# Patient Record
Sex: Male | Born: 1955 | Race: White | Hispanic: No | Marital: Single | Smoking: Former smoker
Health system: Southern US, Community
[De-identification: ages and names within clinical notes are randomized; demographics above are authoritative.]

## PROBLEM LIST (undated history)

## (undated) DIAGNOSIS — I10 Essential (primary) hypertension: Secondary | ICD-10-CM

## (undated) HISTORY — DX: Essential (primary) hypertension: I10

## (undated) HISTORY — PX: REPLACEMENT TOTAL KNEE: SUR1224

---

## 2010-09-25 ENCOUNTER — Other Ambulatory Visit: Payer: Self-pay | Admitting: Orthopedic Surgery

## 2010-09-25 ENCOUNTER — Other Ambulatory Visit (HOSPITAL_COMMUNITY): Payer: Self-pay | Admitting: Orthopedic Surgery

## 2010-09-25 ENCOUNTER — Ambulatory Visit (HOSPITAL_COMMUNITY)
Admission: RE | Admit: 2010-09-25 | Discharge: 2010-09-25 | Disposition: A | Discharge status: Home / Self Care | Payer: 59 | Source: Ambulatory Visit | Attending: Orthopedic Surgery | Admitting: Orthopedic Surgery

## 2010-09-25 ENCOUNTER — Encounter (HOSPITAL_COMMUNITY): Payer: 59

## 2010-09-25 DIAGNOSIS — Z01812 Encounter for preprocedural laboratory examination: Secondary | ICD-10-CM | POA: Insufficient documentation

## 2010-09-25 DIAGNOSIS — Z01818 Encounter for other preprocedural examination: Secondary | ICD-10-CM

## 2010-09-25 DIAGNOSIS — I10 Essential (primary) hypertension: Secondary | ICD-10-CM | POA: Insufficient documentation

## 2010-09-25 DIAGNOSIS — E119 Type 2 diabetes mellitus without complications: Secondary | ICD-10-CM | POA: Insufficient documentation

## 2010-09-25 LAB — BASIC METABOLIC PANEL
BUN: 13 mg/dL (ref 6–23)
CO2: 27 mEq/L (ref 19–32)
Chloride: 100 mEq/L (ref 96–112)
Creatinine, Ser: 1.09 mg/dL (ref 0.50–1.35)

## 2010-09-25 LAB — CBC
MCH: 31.1 pg (ref 26.0–34.0)
MCV: 89 fL (ref 78.0–100.0)
Platelets: 208 10*3/uL (ref 150–400)
RDW: 12.1 % (ref 11.5–15.5)
WBC: 6.1 10*3/uL (ref 4.0–10.5)

## 2010-09-25 LAB — DIFFERENTIAL
Eosinophils Absolute: 0.2 10*3/uL (ref 0.0–0.7)
Eosinophils Relative: 3 % (ref 0–5)
Lymphs Abs: 2.3 10*3/uL (ref 0.7–4.0)
Monocytes Relative: 11 % (ref 3–12)

## 2010-09-25 LAB — URINALYSIS, ROUTINE W REFLEX MICROSCOPIC
Bilirubin Urine: NEGATIVE
Glucose, UA: NEGATIVE mg/dL
Hgb urine dipstick: NEGATIVE
Ketones, ur: NEGATIVE mg/dL
pH: 6.5 (ref 5.0–8.0)

## 2010-10-03 ENCOUNTER — Ambulatory Visit (HOSPITAL_COMMUNITY)
Admission: RE | Admit: 2010-10-03 | Discharge: 2010-10-04 | Disposition: A | Discharge status: Home Health | Payer: 59 | Source: Ambulatory Visit | Attending: Orthopedic Surgery | Admitting: Orthopedic Surgery

## 2010-10-03 DIAGNOSIS — I10 Essential (primary) hypertension: Secondary | ICD-10-CM | POA: Insufficient documentation

## 2010-10-03 DIAGNOSIS — Z79899 Other long term (current) drug therapy: Secondary | ICD-10-CM | POA: Insufficient documentation

## 2010-10-03 DIAGNOSIS — E785 Hyperlipidemia, unspecified: Secondary | ICD-10-CM | POA: Insufficient documentation

## 2010-10-03 DIAGNOSIS — M1710 Unilateral primary osteoarthritis, unspecified knee: Principal | ICD-10-CM | POA: Insufficient documentation

## 2010-10-03 DIAGNOSIS — E119 Type 2 diabetes mellitus without complications: Secondary | ICD-10-CM | POA: Insufficient documentation

## 2010-10-03 LAB — GLUCOSE, CAPILLARY: Glucose-Capillary: 107 mg/dL — ABNORMAL HIGH (ref 70–99)

## 2010-10-03 LAB — TYPE AND SCREEN

## 2010-10-04 LAB — BASIC METABOLIC PANEL
BUN: 11 mg/dL (ref 6–23)
Calcium: 9 mg/dL (ref 8.4–10.5)
Creatinine, Ser: 1.01 mg/dL (ref 0.50–1.35)
GFR calc non Af Amer: >60 mL/min (ref >60)
Glucose, Bld: 130 mg/dL — ABNORMAL HIGH (ref 70–99)
Sodium: 138 mEq/L (ref 135–145)

## 2010-10-04 LAB — CBC
Hemoglobin: 14.3 g/dL (ref 13.0–17.0)
MCH: 30.3 pg (ref 26.0–34.0)
MCHC: 33.4 g/dL (ref 30.0–36.0)
MCV: 90.7 fL (ref 78.0–100.0)

## 2010-10-07 NOTE — Op Note (Signed)
Matthew Harris, Matthew Harris                 ACCOUNT NO.:  [id number]  MEDICAL RECORD NO.:  [id number]  LOCATION:  1609                         FACILITY:  [facility]  PHYSICIAN:  Madlyn Frankel. Charlann Boxer, M.D.  DATE OF BIRTH:  08-05-1955  DATE OF PROCEDURE:  10/03/2010 DATE OF DISCHARGE:                              OPERATIVE REPORT   PREOPERATIVE DIAGNOSIS:  Left knee medial compartment osteoarthritis.  POSTOPERATIVE DIAGNOSIS:  Left knee medial compartment osteoarthritis.  PROCEDURE:  Left partial knee replacement utilizing Biomet components, a size medium twin peg femur, a size B medial left tibial tray and a 3 insert to match the above components.  SURGEON:  Madlyn Frankel. Charlann Boxer, M.D.  ASSISTANT:  Jaquelyn Bitter. Chabon, P.A.  ANESTHESIA:  Spinal.  SPECIMENS:  None.  COMPLICATIONS:  None.  DRAINS:  Hemovac.  BLOOD LOSS:  Minimal.  TOURNIQUET TIME:  39 minutes, 250 mmHg.  INDICATIONS OF THE PROCEDURE:  Mr. Age is a 55 year old gentleman who was seen and evaluated in the office for a left knee pain.  Radiographs revealed bone-on-bone changes medially.  He had failed conservative measures, had bone-on-bone changes and at this point wished for more definitive measures.  After reviewing his radiographic findings, location of his pain, his age and desired activity level, it was felt that he was an ideal candidate for partial knee replacement.  We discussed the risks and benefits, pros and cons of this first total knee replacement.  Standard risk of infection, DVTs, component failure, need for revision surgery all discussed and reviewed. Consent was obtained for benefit of pain relief.  PROCEDURE IN DETAIL:  The patient was brought to operative theater. Once adequate anesthesia and preoperative antibiotics, Ancef administered, the patient was positioned supine with his left thigh tourniquet placed and his left leg placed over the Oxford leg holder to allow for 120 degrees of flexion.  A time-out  was performed, identifying the patient, planned procedure and extremity.  Leg was exsanguinated, tourniquet elevated to 250 mmHg. Paramidline incision made for the proximal pole of the patella to the tibial tubercle, soft-tissue planes created and a partial median arthrotomy made.  Following initial synovectomy and removal of the anterior horn of the medial meniscus, retractors were placed along the medial aspect the proximal tibia and the patellar retractor placed.  The extramedullary guide was passed with a medium spoon in place, the jig for 4-mm resection was placed.  Once I was satisfied that the tibia was lined up parallel to the shaft of the alignment rod, the reciprocating saw was used to the medial aspect of the femoral notch and then the oscillating saw to remove the fragment.  The cut surface seemed to be best fit with a size B tibial tray following removal of medial osteophytes.  The size 3 feeler gauge appeared at this point to have appropriate tension.  At this point, the intramedullary canal of femur was opened, the intramedullary rod passed.  The jig for the posterior cutting block was then positioned onto the cut surface of the proximal tibia and oriented in relationship to the intramedullary rod.  I had marked the midportion of the medial femoral condyle.  The two drill holes  were then made and they were in line with this.  Medial osteophytes had been removed off the distal femur and anteriorly.  The posterior cutting block was then positioned and the posterior resection of the femur made.  At this point based on the flexion gap, I chose a 4-miller from just experience as well as his knee.  I milled the distal femur, removing excessive bone around the knee.  At this point with a medium femur in place, the size 3 feeler gauge was balanced from extension to flexion and felt very good.  At this point, the final tibial preparation was carried out, pinning the tray  into place.  The reciprocating saw was used to create a trough, and small osteotome used to remove bone fragments.  I then milled off the anterior aspect of the femur to allow for the polyethylene motion.  Final trial at this point was carried out with a keeled tray in place at the medium femur and the lollipop 3.  Again, at 90 degrees of flexion and 20 degrees of flexion, there was symmetric tension on this insert.  All trial components were removed.  The sclerotic bone was drilled with a drill.  The knee was irrigated the synovial-capsular junction along the medial aspect of the knee was injected with 0.25% Marcaine with epinephrine and 1 mL of Toradol.  Cement was mixed.  Final components were opened and cemented in position in two-stage technique with a one cement batch.  At first, the tibial component with the trial femur and the knee held at 45 degrees flexion.  Excessive cement was removed.  After a minute and a half, the femoral component was cemented into position.  Again, excessive cement was removed.  Once the cement had fully cured and excess cement was removed throughout the knee, the final three insert was chosen and snapped into position.  The tourniquet had been let down at 39 minutes without significant hemostasis.  A medium Hemovac drain was placed deeply and re-irrigated the knee.  The extensor mechanism was then reapproximated using #1 Vicryl with the knee in flexion.  The remainder of the wound was closed with 2-0 Vicryl and running 4-0 Monocryl.  The knee was cleaned, dried and dressed sterilely with Dermabond and Acquacel dressing.  Drain site dressed separately.  The patient was brought to recovery room with an Ace wrap in place, tolerating the procedure well.     Madlyn Frankel Charlann Boxer, M.D.     MDO/MEDQ  D:  10/03/2010  T:  10/03/2010  Job:  [id number]  Electronically Signed by Durene Romans M.D. on 10/07/2010 07:11:51 AM

## 2010-10-07 NOTE — Discharge Summary (Signed)
Matthew Harris, Matthew Harris                 ACCOUNT NO.:  [id number]  MEDICAL RECORD NO.:  [id number]  LOCATION:  1609                         FACILITY:  [facility]  PHYSICIAN:  Madlyn Frankel. Charlann Boxer, M.D.  DATE OF BIRTH:  04-Jan-1956  DATE OF ADMISSION:  10/03/2010 DATE OF DISCHARGE:  10/04/2010                              DISCHARGE SUMMARY   Patient of Dr. Charlann Boxer.  PROCEDURE:  Left knee unilateral knee replacement, medial compartment.  ADMITTING DIAGNOSIS:  Left knee medial compartment arthritis.  DISCHARGE DIAGNOSES: 1. Status post left unilateral knee arthroplasty (medial side). 2. Hyperlipidemia. 3. Hypertension. 4. Diabetes.  HISTORY OF PRESENT ILLNESS:  The patient is a 55 year old gentleman with a history of medial compartment arthritis of his left knee.  The patient has tried multiple conservative treatments all of which have failed to alleviate his symptoms.  X-rays in the clinic do reveal medial compartment arthritic changes.  Various options were discussed with the patient.  The patient wished to proceed with surgery.  Risks, benefits, and expectations of procedure were discussed with the patient.  The patient understands risks, benefits, and expectations and wished to proceed with left unilateral knee replacement per Dr. Charlann Boxer.  HOSPITAL COURSE:  The patient underwent the above-stated procedure on October 03, 2010.  The patient tolerated the procedure well, was brought to the recovery room in good condition and subsequently to the floor.  Postop day #1, October 04, 2010, the patient doing okay, no events, afebrile, vital signs stable.  Hematocrit was 42.8.  Hemovac was taken out.  The patient had PT x2 with weightbearing as tolerated.  The patient will be discharged home with home health PT.  DISCHARGE CONDITION:  Good.  DISCHARGE INSTRUCTIONS:  The patient will be discharged on October 04, 2010.  The patient will be discharged home with home health.  The patient will be  weightbearing as tolerated.  The patient will maintain surgical dressing for about 8 days after which time he will replace his dressing with gauze and tape.  The patient to keep the area dry and clean until followup.  The patient will follow up with Dr. Charlann Boxer at [facility] in 2 weeks.  The patient is to call with any questions or concerns.  DISCHARGE MEDICATIONS: 1. Aspirin enteric coated 325 mg 1 p.o. b.i.d. for 4 weeks. 2. Benadryl 25 mg 1 p.o. q.4 h. p.r.n. 3. Colace 100 mg b.i.d. constipation. 4. Iron sulfate 325 mg 1 p.o. t.i.d. for 2-3 weeks. 5. Norco 7.5/325 one to two p.o. q.4-6 hours p.r.n. pain. 6. Robaxin 500 mg 1 p.o. q.6 h. p.r.n. muscle spasms. 7. MiraLax 17 g p.o. daily constipation. 8. Benicar 20 mg 1 p.o. q.a.m. 9. Crestor 20 mg 1 p.o. q.a.m. 10.Eye shots 1 injection in the right eye monthly per Dr. Stephannie Li. 11.Metformin 500 mg 1 p.o. b.i.d. 12.Multivitamin 1 p.o. daily. 13.Systane Ultra glycol eye drops 1-2 drops in right eye twice daily     as needed.    ______________________________ Lanney Gins, PA   ______________________________ Madlyn Frankel. Charlann Boxer, M.D.    MB/MEDQ  D:  10/04/2010  T:  10/04/2010  Job:  [id number]  cc:  Dr. Cyndia Bent [facility] [place], [place]  Electronically Signed by Lanney Gins PA on 10/04/2010 02:58:02 PM Electronically Signed by Durene Romans M.D. on 10/07/2010 07:11:55 AM

## 2010-10-09 LAB — GLUCOSE, CAPILLARY

## 2013-02-19 IMAGING — CR DG CHEST 2V
2 series · 2 of 2 positions shown · non-contrast
Comparison: None.

CLINICAL DATA: Preop for left knee replacement.  Hypertension.
Diabetic.

CHEST - 2 VIEW

[w chest pa]
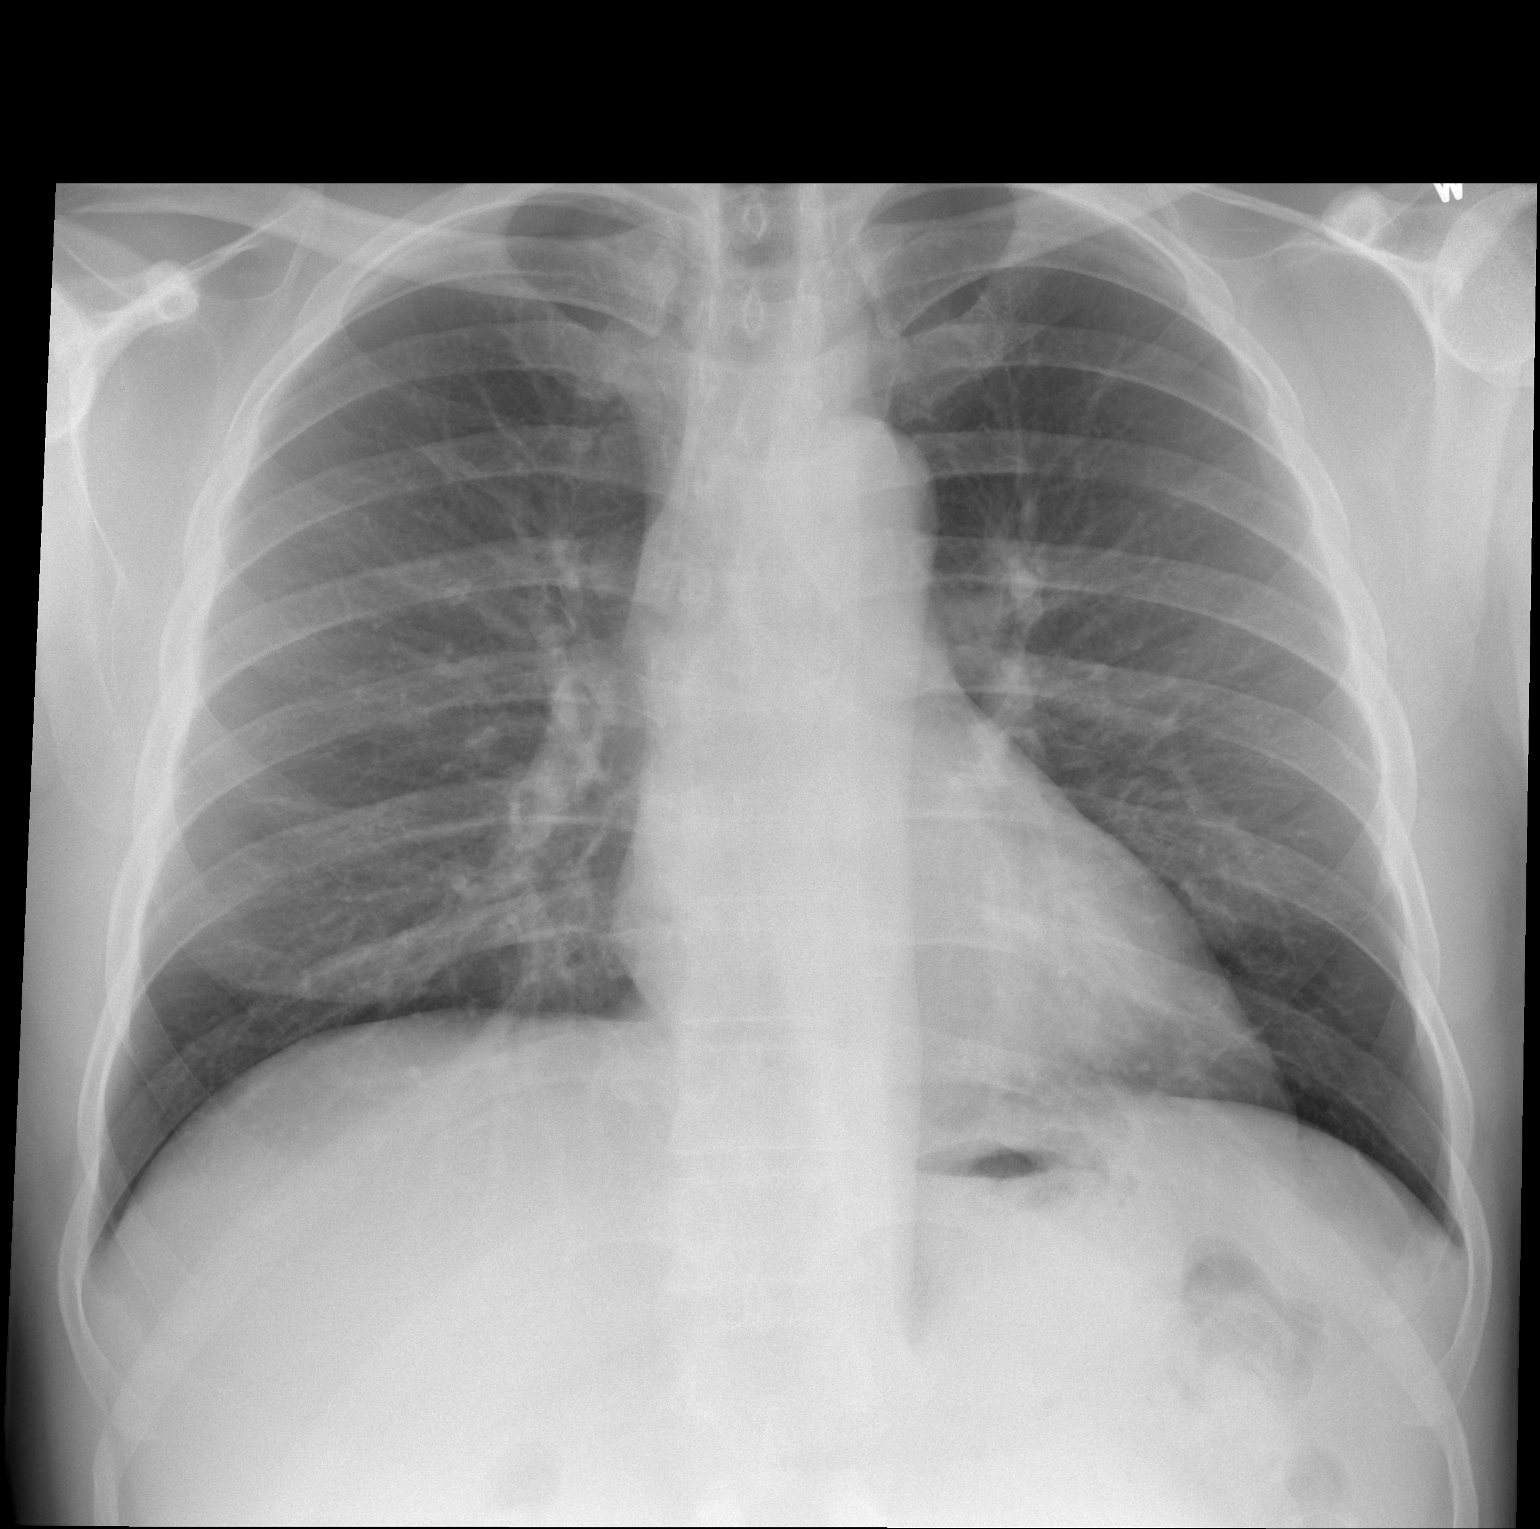

[w chest lat]
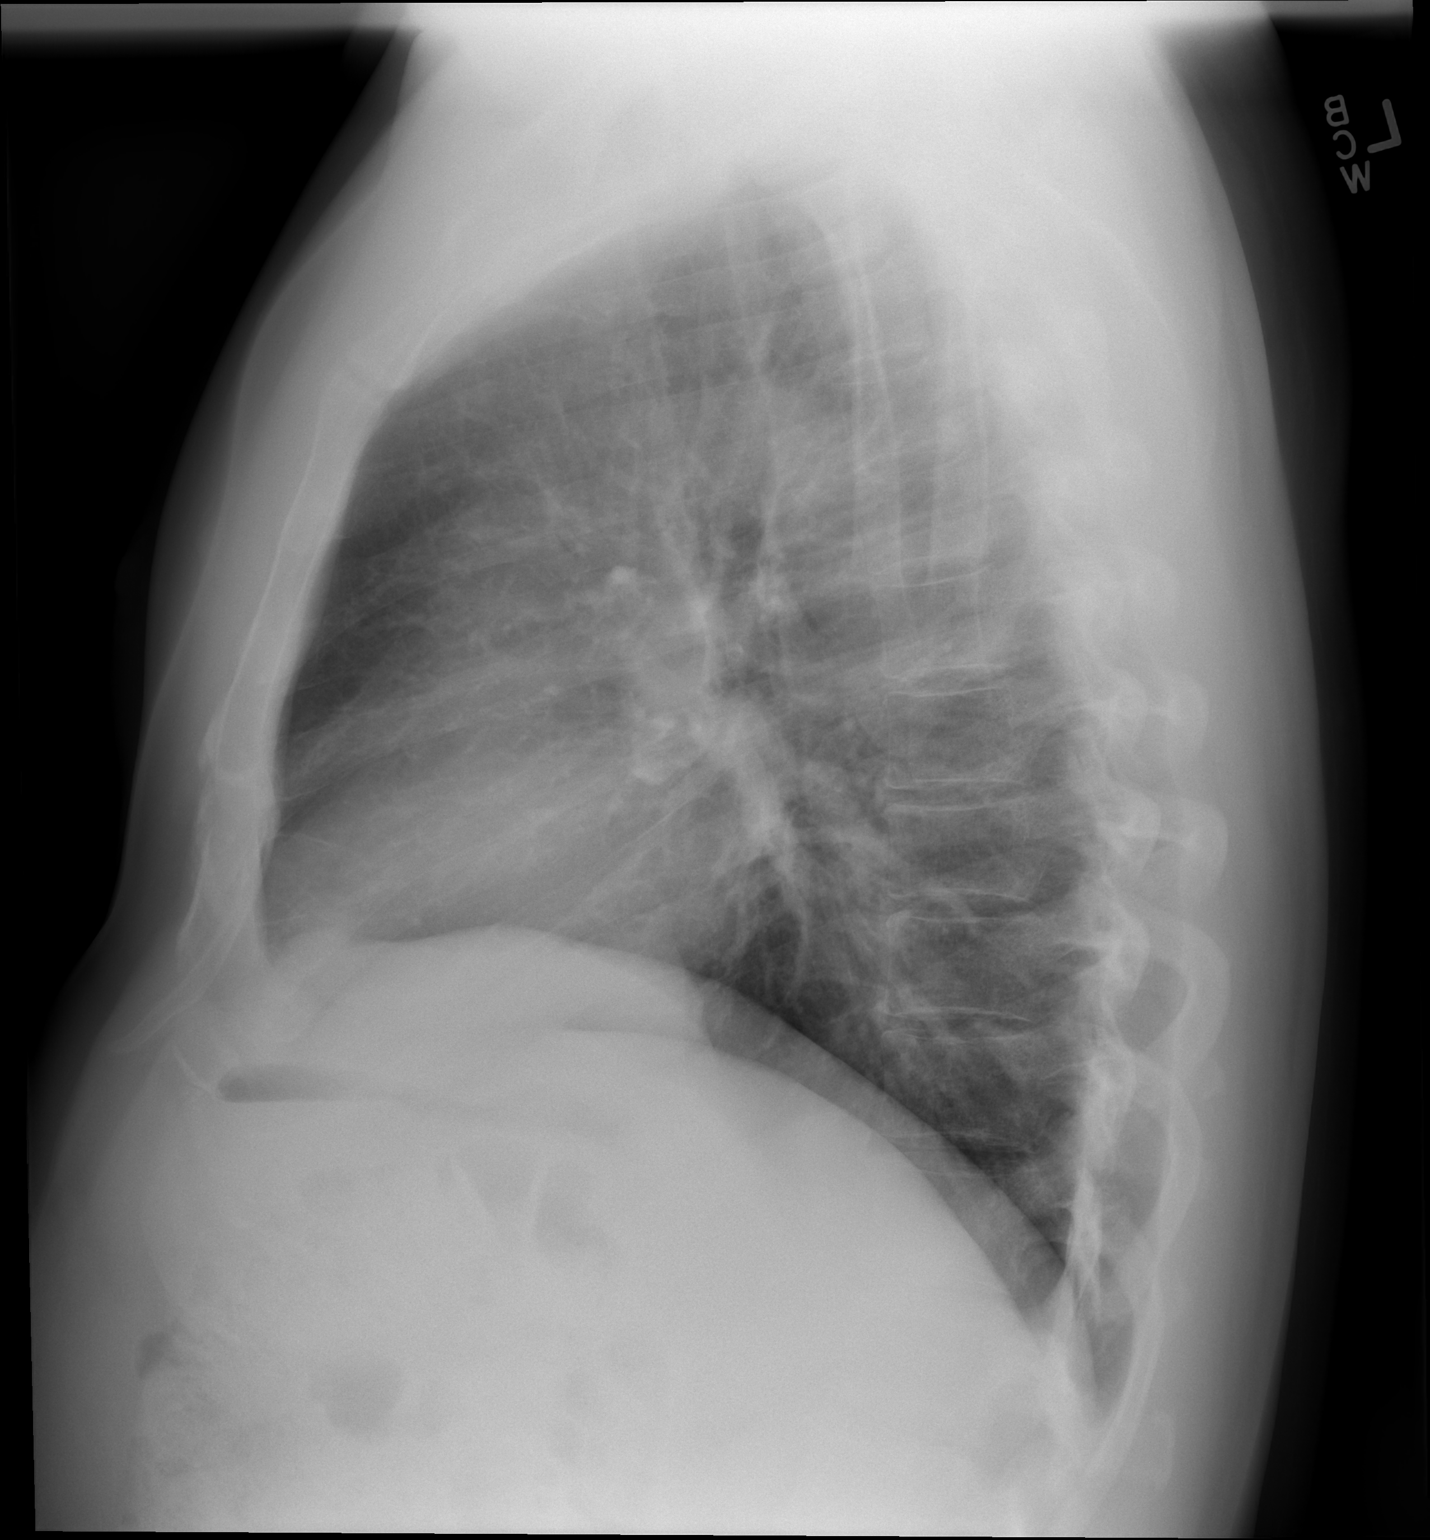

[2 of 2 positions shown; findings below may reference images not displayed]

FINDINGS: Midline trachea.  Normal heart size and mediastinal
contours. No pleural effusion or pneumothorax.  Clear lungs.
IMPRESSION: No acute cardiopulmonary disease.

## 2015-02-07 LAB — HM DIABETES EYE EXAM

## 2016-03-19 LAB — HM DIABETES EYE EXAM

## 2016-06-07 DIAGNOSIS — E785 Hyperlipidemia, unspecified: Secondary | ICD-10-CM | POA: Insufficient documentation

## 2016-06-07 DIAGNOSIS — E1165 Type 2 diabetes mellitus with hyperglycemia: Secondary | ICD-10-CM | POA: Insufficient documentation

## 2017-03-06 LAB — HM DIABETES EYE EXAM

## 2018-03-17 LAB — HM DIABETES EYE EXAM

## 2018-10-21 DIAGNOSIS — M25561 Pain in right knee: Secondary | ICD-10-CM | POA: Insufficient documentation

## 2018-10-30 DIAGNOSIS — R0683 Snoring: Secondary | ICD-10-CM | POA: Insufficient documentation

## 2018-10-30 DIAGNOSIS — R9431 Abnormal electrocardiogram [ECG] [EKG]: Secondary | ICD-10-CM | POA: Insufficient documentation

## 2018-11-13 DIAGNOSIS — I712 Thoracic aortic aneurysm, without rupture, unspecified: Secondary | ICD-10-CM | POA: Insufficient documentation

## 2018-12-23 DIAGNOSIS — M79644 Pain in right finger(s): Secondary | ICD-10-CM | POA: Insufficient documentation

## 2018-12-23 DIAGNOSIS — M65311 Trigger thumb, right thumb: Secondary | ICD-10-CM | POA: Insufficient documentation

## 2019-03-09 DIAGNOSIS — N183 Chronic kidney disease, stage 3 unspecified: Secondary | ICD-10-CM | POA: Insufficient documentation

## 2019-03-09 DIAGNOSIS — G4733 Obstructive sleep apnea (adult) (pediatric): Secondary | ICD-10-CM | POA: Insufficient documentation

## 2019-07-13 ENCOUNTER — Other Ambulatory Visit: Payer: Self-pay

## 2019-07-13 ENCOUNTER — Encounter: Payer: Self-pay | Admitting: Family Medicine

## 2019-07-13 ENCOUNTER — Ambulatory Visit: Payer: 59 | Admitting: Family Medicine

## 2019-07-13 VITALS — BP 128/76 | HR 68 | Temp 97.0°F | Ht 69.5 in | Wt 195.2 lb

## 2019-07-13 DIAGNOSIS — M1990 Unspecified osteoarthritis, unspecified site: Secondary | ICD-10-CM

## 2019-07-13 DIAGNOSIS — Z125 Encounter for screening for malignant neoplasm of prostate: Secondary | ICD-10-CM | POA: Diagnosis not present

## 2019-07-13 DIAGNOSIS — I10 Essential (primary) hypertension: Secondary | ICD-10-CM | POA: Diagnosis not present

## 2019-07-13 DIAGNOSIS — E1169 Type 2 diabetes mellitus with other specified complication: Secondary | ICD-10-CM

## 2019-07-13 DIAGNOSIS — E785 Hyperlipidemia, unspecified: Secondary | ICD-10-CM

## 2019-07-13 DIAGNOSIS — Z6828 Body mass index (BMI) 28.0-28.9, adult: Secondary | ICD-10-CM

## 2019-07-13 DIAGNOSIS — E663 Overweight: Secondary | ICD-10-CM

## 2019-07-13 DIAGNOSIS — Z0001 Encounter for general adult medical examination with abnormal findings: Secondary | ICD-10-CM

## 2019-07-13 LAB — URINALYSIS, ROUTINE W REFLEX MICROSCOPIC
Bilirubin Urine: NEGATIVE
Hgb urine dipstick: NEGATIVE
Ketones, ur: NEGATIVE
Leukocytes,Ua: NEGATIVE
Nitrite: NEGATIVE
RBC / HPF: NONE SEEN (ref 0–?)
Specific Gravity, Urine: 1.01 (ref 1.000–1.030)
Urine Glucose: 100 — AB
Urobilinogen, UA: 0.2 (ref 0.0–1.0)
WBC, UA: NONE SEEN (ref 0–?)
pH: 7 (ref 5.0–8.0)

## 2019-07-13 LAB — CBC
HCT: 48.4 % (ref 39.0–52.0)
Hemoglobin: 16.2 g/dL (ref 13.0–17.0)
MCHC: 33.4 g/dL (ref 30.0–36.0)
MCV: 92.3 fl (ref 78.0–100.0)
Platelets: 209 10*3/uL (ref 150.0–400.0)
RBC: 5.25 Mil/uL (ref 4.22–5.81)
RDW: 13.2 % (ref 11.5–15.5)
WBC: 5.3 10*3/uL (ref 4.0–10.5)

## 2019-07-13 LAB — COMPREHENSIVE METABOLIC PANEL
ALT: 39 U/L (ref 0–53)
AST: 28 U/L (ref 0–37)
Albumin: 5 g/dL (ref 3.5–5.2)
Alkaline Phosphatase: 65 U/L (ref 39–117)
BUN: 14 mg/dL (ref 6–23)
CO2: 26 mEq/L (ref 19–32)
Calcium: 9.7 mg/dL (ref 8.4–10.5)
Chloride: 97 mEq/L (ref 96–112)
Creatinine, Ser: 1.03 mg/dL (ref 0.40–1.50)
GFR: 73.09 mL/min (ref >60.00)
Glucose, Bld: 203 mg/dL — ABNORMAL HIGH (ref 70–99)
Potassium: 4.4 mEq/L (ref 3.5–5.1)
Sodium: 135 mEq/L (ref 135–145)
Total Bilirubin: 0.5 mg/dL (ref 0.2–1.2)
Total Protein: 7.7 g/dL (ref 6.0–8.3)

## 2019-07-13 LAB — MICROALBUMIN / CREATININE URINE RATIO
Creatinine,U: 26.7 mg/dL
Microalb Creat Ratio: 76 mg/g — ABNORMAL HIGH (ref 0.0–30.0)
Microalb, Ur: 20.3 mg/dL — ABNORMAL HIGH (ref 0.0–1.9)

## 2019-07-13 LAB — LIPID PANEL
Cholesterol: 144 mg/dL (ref 0–200)
HDL: 38.9 mg/dL — ABNORMAL LOW (ref >39.00)
NonHDL: 105.47
Total CHOL/HDL Ratio: 4
Triglycerides: 313 mg/dL — ABNORMAL HIGH (ref 0.0–149.0)
VLDL: 62.6 mg/dL — ABNORMAL HIGH (ref 0.0–40.0)

## 2019-07-13 LAB — PSA: PSA: 0.47 ng/mL (ref 0.10–4.00)

## 2019-07-13 LAB — LDL CHOLESTEROL, DIRECT: Direct LDL: 61 mg/dL

## 2019-07-13 LAB — HEMOGLOBIN A1C: Hgb A1c MFr Bld: 9.7 % — ABNORMAL HIGH (ref 4.6–6.5)

## 2019-07-13 LAB — TSH: TSH: 3.22 u[IU]/mL (ref 0.35–4.50)

## 2019-07-13 NOTE — Assessment & Plan Note (Signed)
At goal.  Continue amlodipine-olmesartan 10-40 once daily.  Check CBC, C met, TSH.

## 2019-07-13 NOTE — Assessment & Plan Note (Signed)
Currently seeing orthopedics.  We will continue management as per their direction.  Recommended compression sleeve, ice, over-the-counter analgesics as well.

## 2019-07-13 NOTE — Patient Instructions (Signed)
It was very nice to see you today!  We will check blood work and a urine sample today.  We may make a change to your diabetes meds depending on your blood work.  I will see you back in 3 to 6 months depending on your blood work.  Take care, Dr Jerline Pain  Please try these tips to maintain a healthy lifestyle:   Eat at least 3 REAL meals and 1-2 snacks per day.  Aim for no more than 5 hours between eating.  If you eat breakfast, please do so within one hour of getting up.    Each meal should contain half fruits/vegetables, one quarter protein, and one quarter carbs (no bigger than a computer mouse)   Cut down on sweet beverages. This includes juice, soda, and sweet tea.     Drink at least 1 glass of water with each meal and aim for at least 8 glasses per day   Exercise at least 150 minutes every week.    Preventive Care 77-48 Years Old, Male Preventive care refers to lifestyle choices and visits with your health care provider that can promote health and wellness. This includes:  A yearly physical exam. This is also called an annual well check.  Regular dental and eye exams.  Immunizations.  Screening for certain conditions.  Healthy lifestyle choices, such as eating a healthy diet, getting regular exercise, not using drugs or products that contain nicotine and tobacco, and limiting alcohol use. What can I expect for my preventive care visit? Physical exam Your health care provider will check:  Height and weight. These may be used to calculate body mass index (BMI), which is a measurement that tells if you are at a healthy weight.  Heart rate and blood pressure.  Your skin for abnormal spots. Counseling Your health care provider may ask you questions about:  Alcohol, tobacco, and drug use.  Emotional well-being.  Home and relationship well-being.  Sexual activity.  Eating habits.  Work and work Statistician. What immunizations do I need?  Influenza (flu)  vaccine  This is recommended every year. Tetanus, diphtheria, and pertussis (Tdap) vaccine  You may need a Td booster every 10 years. Varicella (chickenpox) vaccine  You may need this vaccine if you have not already been vaccinated. Zoster (shingles) vaccine  You may need this after age 16. Measles, mumps, and rubella (MMR) vaccine  You may need at least one dose of MMR if you were born in 1957 or later. You may also need a second dose. Pneumococcal conjugate (PCV13) vaccine  You may need this if you have certain conditions and were not previously vaccinated. Pneumococcal polysaccharide (PPSV23) vaccine  You may need one or two doses if you smoke cigarettes or if you have certain conditions. Meningococcal conjugate (MenACWY) vaccine  You may need this if you have certain conditions. Hepatitis A vaccine  You may need this if you have certain conditions or if you travel or work in places where you may be exposed to hepatitis A. Hepatitis B vaccine  You may need this if you have certain conditions or if you travel or work in places where you may be exposed to hepatitis B. Haemophilus influenzae type b (Hib) vaccine  You may need this if you have certain risk factors. Human papillomavirus (HPV) vaccine  If recommended by your health care provider, you may need three doses over 6 months. You may receive vaccines as individual doses or as more than one vaccine together in one  shot (combination vaccines). Talk with your health care provider about the risks and benefits of combination vaccines. What tests do I need? Blood tests  Lipid and cholesterol levels. These may be checked every 5 years, or more frequently if you are over 28 years old.  Hepatitis C test.  Hepatitis B test. Screening  Lung cancer screening. You may have this screening every year starting at age 61 if you have a 30-pack-year history of smoking and currently smoke or have quit within the past 15  years.  Prostate cancer screening. Recommendations will vary depending on your family history and other risks.  Colorectal cancer screening. All adults should have this screening starting at age 46 and continuing until age 40. Your health care provider may recommend screening at age 66 if you are at increased risk. You will have tests every 1-10 years, depending on your results and the type of screening test.  Diabetes screening. This is done by checking your blood sugar (glucose) after you have not eaten for a while (fasting). You may have this done every 1-3 years.  Sexually transmitted disease (STD) testing. Follow these instructions at home: Eating and drinking  Eat a diet that includes fresh fruits and vegetables, whole grains, lean protein, and low-fat dairy products.  Take vitamin and mineral supplements as recommended by your health care provider.  Do not drink alcohol if your health care provider tells you not to drink.  If you drink alcohol: ? Limit how much you have to 0-2 drinks a day. ? Be aware of how much alcohol is in your drink. In the [place] one drink equals one 12 oz bottle of beer (355 mL), one 5 oz glass of wine (148 mL), or one 1 oz glass of hard liquor (44 mL). Lifestyle  Take daily care of your teeth and gums.  Stay active. Exercise for at least 30 minutes on 5 or more days each week.  Do not use any products that contain nicotine or tobacco, such as cigarettes, e-cigarettes, and chewing tobacco. If you need help quitting, ask your health care provider.  If you are sexually active, practice safe sex. Use a condom or other form of protection to prevent STIs (sexually transmitted infections).  Talk with your health care provider about taking a low-dose aspirin every day starting at age 53. What's next?  Go to your health care provider once a year for a well check visit.  Ask your health care provider how often you should have your eyes and teeth  checked.  Stay up to date on all vaccines. This information is not intended to replace advice given to you by your health care provider. Make sure you discuss any questions you have with your health care provider. Document Revised: 01/09/2018 Document Reviewed: 01/09/2018 Elsevier Patient Education  2020 [employer].

## 2019-07-13 NOTE — Assessment & Plan Note (Signed)
Continue Crestor 10 mg daily.  Check CBC, C met, TSH, lipid panel. 

## 2019-07-13 NOTE — Progress Notes (Signed)
Chief Complaint:  Matthew Harris is a 64 y.o. male who presents today for his annual comprehensive physical exam.    Assessment/Plan:  Chronic Problems Addressed Today: Osteoarthritis Currently seeing orthopedics.  We will continue management as per their direction.  Recommended compression sleeve, ice, over-the-counter analgesics as well.  Hyperlipidemia Continue Crestor 10 mg daily.  Check CBC, C met, TSH, lipid panel.  HTN (hypertension) At goal.  Continue amlodipine-olmesartan 10-40 once daily.  Check CBC, C met, TSH.  DM type 2 with diabetic dyslipidemia (HCC) Check A1c.  Continue Metformin 1000 mg twice daily.  If significantly elevated would consider trial of Bydureon or Ozempic.   Body mass index is 28.42 kg/m. / Overweight  BMI Metric Follow Up - 07/13/19 1021      BMI Metric Follow Up-Please document annually   BMI Metric Follow Up Education provided            Preventative Healthcare: Obtain records from previous PCP.  Will check CBC, C met, TSH, PSA, lipid panel.  Up-to-date on colon cancer screening and vaccines.  Patient Counseling(The following topics were reviewed and/or handout was given):  -Nutrition: Stressed importance of moderation in sodium/caffeine intake, saturated fat and cholesterol, caloric balance, sufficient intake of fresh fruits, vegetables, and fiber.  -Stressed the importance of regular exercise.   -Substance Abuse: Discussed cessation/primary prevention of tobacco, alcohol, or other drug use; driving or other dangerous activities under the influence; availability of treatment for abuse.   -Injury prevention: Discussed safety belts, safety helmets, smoke detector, smoking near bedding or upholstery.   -Sexuality: Discussed sexually transmitted diseases, partner selection, use of condoms, avoidance of unintended pregnancy and contraceptive alternatives.   -Dental health: Discussed importance of regular tooth brushing, flossing, and dental  visits.  -Health maintenance and immunizations reviewed. Please refer to Health maintenance section.  Return to care in 1 year for next preventative visit.     Subjective:  HPI:  He has no acute complaints today.   His chronic medical conditions are outlined below:  # Essential Hypertension - On amlodipine 10-80m once daily and tolerating well  # T2DM - On metformin 10020mbid and tolerating well  # Dyslipidemia - On crestor 1057maily and tolerating well  # OSA on CPAP  # Osteoarthritis  Lifestyle Diet: Balanced. Plenty of fruits and vegetables.  Exercise: Busy with golf and yard work. Working out at home.   No flowsheet data found.  Health Maintenance Due  Topic Date Due  . HEMOGLOBIN A1C  Never done  . Hepatitis C Screening  Never done  . PNEUMOCOCCAL POLYSACCHARIDE VACCINE AGE 61-64 HIGH RISK  Never done  . FOOT EXAM  Never done  . OPHTHALMOLOGY EXAM  Never done  . COVID-19 Vaccine (1) Never done  . HIV Screening  Never done  . COLONOSCOPY  07/15/2018     ROS: Per HPI, otherwise a complete review of systems was negative.   PMH:  The following were reviewed and entered/updated in epic: Past Medical History:  Diagnosis Date  . Hypertension    Patient Active Problem List   Diagnosis Date Noted  . Osteoarthritis 07/13/2019  . OSA on CPAP 03/09/2019  . DM type 2 with diabetic dyslipidemia (HCCMoquino5/10/2016  . HTN (hypertension) 11/15/2011  . Hyperlipidemia 11/15/2011   Past Surgical History:  Procedure Laterality Date  . REPLACEMENT TOTAL KNEE Left     History reviewed. No pertinent family history.  Medications- reviewed and updated Current Outpatient Medications  Medication Sig Dispense  Refill  . amLODipine-olmesartan (AZOR) 10-40 MG tablet Take 1 tablet by mouth daily.    Marland Kitchen aspirin (ASPIR-LOW) 81 MG EC tablet     . metFORMIN (GLUCOPHAGE-XR) 500 MG 24 hr tablet Take 4 tablets by mouth daily.    . Multiple Vitamins-Minerals (MULTIVITAMIN MEN  50+) TABS     . rosuvastatin (CRESTOR) 10 MG tablet Take 10 mg by mouth daily.     No current facility-administered medications for this visit.    Allergies-reviewed and updated No Known Allergies  Social History   Socioeconomic History  . Marital status: Single    Spouse name: Not on file  . Number of children: Not on file  . Years of education: Not on file  . Highest education level: Not on file  Occupational History  . Not on file  Tobacco Use  . Smoking status: Former Smoker    Quit date: 07/13/1990    Years since quitting: 29.0  . Smokeless tobacco: Never Used  Vaping Use  . Vaping Use: Never used  Substance and Sexual Activity  . Alcohol use: Yes  . Drug use: Never  . Sexual activity: Not on file  Other Topics Concern  . Not on file  Social History Narrative  . Not on file   Social Determinants of Health   Financial Resource Strain:   . Difficulty of Paying Living Expenses:   Food Insecurity:   . Worried About Charity fundraiser in the Last Year:   . Arboriculturist in the Last Year:   Transportation Needs:   . Film/video editor (Medical):   Marland Kitchen Lack of Transportation (Non-Medical):   Physical Activity:   . Days of Exercise per Week:   . Minutes of Exercise per Session:   Stress:   . Feeling of Stress :   Social Connections:   . Frequency of Communication with Friends and Family:   . Frequency of Social Gatherings with Friends and Family:   . Attends Religious Services:   . Active Member of Clubs or Organizations:   . Attends Archivist Meetings:   Marland Kitchen Marital Status:         Objective:  Physical Exam: BP 128/76   Pulse 68   Temp (!) 97 F (36.1 C)   Ht 5' 9.5" (1.765 m)   Wt 195 lb 4 oz (88.6 kg)   SpO2 98%   BMI 28.42 kg/m   Body mass index is 28.42 kg/m. Wt Readings from Last 3 Encounters:  07/13/19 195 lb 4 oz (88.6 kg)  Gen: NAD, resting comfortably HEENT: TMs normal bilaterally. OP clear. No thyromegaly noted.  CV:  RRR with no murmurs appreciated Pulm: NWOB, CTAB with no crackles, wheezes, or rhonchi GI: Normal bowel sounds present. Soft, Nontender, Nondistended. MSK: no edema, cyanosis, or clubbing noted Skin: warm, dry Neuro: CN2-12 grossly intact. Strength 5/5 in upper and lower extremities. Reflexes symmetric and intact bilaterally.  Psych: Normal affect and thought content     Caleb M. Jerline Pain, MD 07/13/2019 10:23 AM

## 2019-07-13 NOTE — Assessment & Plan Note (Signed)
Check A1c.  Continue Metformin 1000 mg twice daily.  If significantly elevated would consider trial of Bydureon or Ozempic.

## 2019-07-14 ENCOUNTER — Encounter: Payer: Self-pay | Admitting: Family Medicine

## 2019-07-14 NOTE — Progress Notes (Signed)
Please inform patient of the following:  A1c elevated but everything else is stable. Would like for him to retry ozempic. Can start with 0.25mg  once weekly. Please send in new rx. We can recheck in 3 months. Would like for him to continue continue working on diet and exercise as well.  Katina Degree. [name], MD 07/14/2019 4:26 PM

## 2019-07-15 ENCOUNTER — Other Ambulatory Visit: Payer: Self-pay | Admitting: *Deleted

## 2019-07-15 MED ORDER — OZEMPIC (0.25 OR 0.5 MG/DOSE) 2 MG/1.5ML ~~LOC~~ SOPN: 0.2500 mg | PEN_INJECTOR | SUBCUTANEOUS | 3 refills | Status: DC

## 2019-07-15 NOTE — Telephone Encounter (Signed)
Pt.notified

## 2019-07-15 NOTE — Progress Notes (Signed)
zempic

## 2019-08-04 ENCOUNTER — Telehealth: Payer: Self-pay

## 2019-08-04 NOTE — Telephone Encounter (Signed)
error 

## 2019-08-11 ENCOUNTER — Encounter: Payer: Self-pay | Admitting: Family Medicine

## 2019-09-03 ENCOUNTER — Telehealth: Payer: Self-pay

## 2019-09-03 NOTE — Telephone Encounter (Signed)
Pt would like a prescription for 2 more needles sent in for his Semaglutide,0.25 or 0.5MG /DOS, (OZEMPIC, 0.25 OR 0.5 MG/DOSE,) 2 MG/1.5ML SOPN  The pharmacist told him his pen contains 8 doses, so he wants to get out as much of this pen as possible

## 2019-09-04 ENCOUNTER — Other Ambulatory Visit: Payer: Self-pay

## 2019-09-04 MED ORDER — OZEMPIC (0.25 OR 0.5 MG/DOSE) 2 MG/1.5ML ~~LOC~~ SOPN: 0.2500 mg | PEN_INJECTOR | SUBCUTANEOUS | 0 refills | Status: DC

## 2019-09-04 NOTE — Telephone Encounter (Signed)
Rx sent 

## 2019-10-21 ENCOUNTER — Telehealth: Payer: Self-pay | Admitting: Family Medicine

## 2019-10-21 NOTE — Telephone Encounter (Signed)
Please advise 

## 2019-10-21 NOTE — Telephone Encounter (Signed)
Patient called in and stated that he wanted to see if he could have the extra needles sent in for his ozempic.

## 2019-10-23 ENCOUNTER — Other Ambulatory Visit: Payer: Self-pay

## 2019-10-23 MED ORDER — OZEMPIC (0.25 OR 0.5 MG/DOSE) 2 MG/1.5ML ~~LOC~~ SOPN: 0.2500 mg | PEN_INJECTOR | SUBCUTANEOUS | 0 refills | Status: DC

## 2019-10-23 NOTE — Telephone Encounter (Signed)
Ok to send in.  

## 2019-10-24 MED ORDER — INSULIN PEN NEEDLE 32G X 4 MM MISC: 2 refills | Status: DC

## 2019-10-24 NOTE — Telephone Encounter (Signed)
Left message on voicemail Rx for pen needles sent to pharmacy as requested.

## 2019-11-10 ENCOUNTER — Encounter: Payer: Self-pay | Admitting: Family Medicine

## 2019-11-10 ENCOUNTER — Ambulatory Visit (INDEPENDENT_AMBULATORY_CARE_PROVIDER_SITE_OTHER): Payer: 59

## 2019-11-10 ENCOUNTER — Other Ambulatory Visit: Payer: Self-pay

## 2019-11-10 DIAGNOSIS — Z23 Encounter for immunization: Secondary | ICD-10-CM

## 2019-12-15 ENCOUNTER — Ambulatory Visit: Payer: 59 | Admitting: Family Medicine

## 2020-01-11 ENCOUNTER — Ambulatory Visit: Payer: 59 | Admitting: Family Medicine

## 2020-01-11 ENCOUNTER — Encounter: Payer: Self-pay | Admitting: Family Medicine

## 2020-01-11 ENCOUNTER — Other Ambulatory Visit: Payer: Self-pay

## 2020-01-11 VITALS — BP 145/78 | HR 71 | Temp 97.9°F | Ht 69.5 in | Wt 193.0 lb

## 2020-01-11 DIAGNOSIS — E785 Hyperlipidemia, unspecified: Secondary | ICD-10-CM

## 2020-01-11 DIAGNOSIS — E1159 Type 2 diabetes mellitus with other circulatory complications: Secondary | ICD-10-CM

## 2020-01-11 DIAGNOSIS — I152 Hypertension secondary to endocrine disorders: Secondary | ICD-10-CM | POA: Diagnosis not present

## 2020-01-11 DIAGNOSIS — E1169 Type 2 diabetes mellitus with other specified complication: Secondary | ICD-10-CM

## 2020-01-11 LAB — POCT GLYCOSYLATED HEMOGLOBIN (HGB A1C): Hemoglobin A1C: 8.1 % — AB (ref 4.0–5.6)

## 2020-01-11 MED ORDER — METFORMIN HCL ER 500 MG PO TB24: 2000.0000 mg | ORAL_TABLET | Freq: Every day | ORAL | 3 refills | Status: DC

## 2020-01-11 MED ORDER — ROSUVASTATIN CALCIUM 10 MG PO TABS: 10.0000 mg | ORAL_TABLET | Freq: Every day | ORAL | 3 refills | Status: DC

## 2020-01-11 MED ORDER — AMLODIPINE-OLMESARTAN 10-40 MG PO TABS: 1.0000 | ORAL_TABLET | Freq: Every day | ORAL | 3 refills | Status: DC

## 2020-01-11 NOTE — Patient Instructions (Signed)
It was very nice to see you today!  You A1c is much better today at 8.1.  Please increase ozempic to 0.5mg  weekly.  I will see you back in 3 months for you annual physical.  Please come back to see me sooner if needed.  Take care, Dr [name]  Please try these tips to maintain a healthy lifestyle:   Eat at least 3 REAL meals and 1-2 snacks per day.  Aim for no more than 5 hours between eating.  If you eat breakfast, please do so within one hour of getting up.    Each meal should contain half fruits/vegetables, one quarter protein, and one quarter carbs (no bigger than a computer mouse)   Cut down on sweet beverages. This includes juice, soda, and sweet tea.     Drink at least 1 glass of water with each meal and aim for at least 8 glasses per day   Exercise at least 150 minutes every week.

## 2020-01-11 NOTE — Assessment & Plan Note (Signed)
A1c improved 8.1.  Will increase Ozempic to 0.5 mg daily.  He will continue Metformin 2000 mg total daily.  Recheck in 3 months.  Discussed lifestyle modifications.  He is working on cutting out carbs.

## 2020-01-11 NOTE — Assessment & Plan Note (Signed)
Last LDL at goal.  Continue Crestor 10 mg daily.  Check lipids with next blood draw.

## 2020-01-11 NOTE — Assessment & Plan Note (Signed)
At goal per JNC 8. Has previously been well controlled.  Continue amlodipine-olmesartan 10-40 once daily.

## 2020-01-11 NOTE — Progress Notes (Signed)
   Matthew Harris is a 64 y.o. male who presents today for an office visit.  Assessment/Plan:  Chronic Problems Addressed Today: Dyslipidemia associated with type 2 diabetes mellitus (HCC) Last LDL at goal.  Continue Crestor 10 mg daily.  Check lipids with next blood draw.  Hypertension associated with diabetes (HCC) At goal per JNC 8. Has previously been well controlled.  Continue amlodipine-olmesartan 10-40 once daily.  DM type 2 with diabetic dyslipidemia (HCC) A1c improved 8.1.  Will increase Ozempic to 0.5 mg daily.  He will continue Metformin 2000 mg total daily.  Recheck in 3 months.  Discussed lifestyle modifications.  He is working on cutting out carbs.    Subjective:  HPI:  See a/p.        Objective:  Physical Exam: BP (!) 145/78   Pulse 71   Temp 97.9 F (36.6 C) (Temporal)   Ht 5' 9.5" (1.765 m)   Wt 193 lb (87.5 kg)   SpO2 98%   BMI 28.09 kg/m   Wt Readings from Last 3 Encounters:  01/11/20 193 lb (87.5 kg)  07/13/19 195 lb 4 oz (88.6 kg)  Gen: No acute distress, resting comfortably Neuro: Grossly normal, moves all extremities Psych: Normal affect and thought content      Erynne Kealey M. [name], MD 01/11/2020 8:34 AM

## 2020-02-11 ENCOUNTER — Other Ambulatory Visit: Payer: Self-pay | Admitting: Family Medicine

## 2020-02-12 ENCOUNTER — Other Ambulatory Visit: Payer: Self-pay | Admitting: Family Medicine

## 2020-02-29 ENCOUNTER — Encounter: Payer: Self-pay | Admitting: Family Medicine

## 2020-02-29 NOTE — Telephone Encounter (Signed)
Please advise 

## 2020-03-19 ENCOUNTER — Other Ambulatory Visit: Payer: Self-pay | Admitting: Family Medicine

## 2020-03-21 ENCOUNTER — Other Ambulatory Visit: Payer: Self-pay

## 2020-03-21 ENCOUNTER — Telehealth: Payer: Self-pay

## 2020-03-21 MED ORDER — OZEMPIC (0.25 OR 0.5 MG/DOSE) 2 MG/1.5ML ~~LOC~~ SOPN: 0.5000 mg | PEN_INJECTOR | SUBCUTANEOUS | 0 refills | Status: DC

## 2020-03-21 NOTE — Telephone Encounter (Signed)
Rx sent 

## 2020-03-21 NOTE — Telephone Encounter (Signed)
..   LAST APPOINTMENT DATE: 03/19/2020   NEXT APPOINTMENT DATE:@Visit  date not found  MEDICATION:OZEMPIC, 0.5 MG/DOSE, 2 MG/1.5ML SOPN    PHARMACY:[id number] - [place], [place] - [address] [place] [address] [place] [address]

## 2020-05-10 LAB — HM DIABETES EYE EXAM

## 2020-06-02 ENCOUNTER — Encounter: Payer: Self-pay | Admitting: Family Medicine

## 2020-06-28 ENCOUNTER — Other Ambulatory Visit: Payer: Self-pay | Admitting: Family Medicine

## 2020-08-06 ENCOUNTER — Other Ambulatory Visit: Payer: Self-pay | Admitting: Family Medicine

## 2020-10-05 ENCOUNTER — Other Ambulatory Visit: Payer: Self-pay | Admitting: Family Medicine

## 2020-10-17 ENCOUNTER — Other Ambulatory Visit: Payer: Self-pay

## 2020-10-17 ENCOUNTER — Telehealth (INDEPENDENT_AMBULATORY_CARE_PROVIDER_SITE_OTHER): Payer: 59 | Admitting: Family Medicine

## 2020-10-17 DIAGNOSIS — U071 COVID-19: Secondary | ICD-10-CM

## 2020-10-17 MED ORDER — MOLNUPIRAVIR EUA 200MG CAPSULE: 4.0000 | ORAL_CAPSULE | Freq: Two times a day (BID) | ORAL | 0 refills | Status: AC

## 2020-10-17 NOTE — Progress Notes (Signed)
Patient ID: Matthew Harris, male   DOB: 1955-06-30, 65 y.o.   MRN: [id number]  This visit type was conducted due to national recommendations for restrictions regarding the COVID-19 pandemic in an effort to limit this patient's exposure and mitigate transmission in our community.   Virtual Visit via Video Note  I connected with Matthew Harris on 10/17/20 at 10:00 AM EDT by a video enabled telemedicine application and verified that I am speaking with the correct person using two identifiers.  Location patient: home Location provider:work or home office Persons participating in the virtual visit: patient, provider  I discussed the limitations of evaluation and management by telemedicine and the availability of in person appointments. The patient expressed understanding and agreed to proceed.   HPI: [name] has COVID-19 infection.  Yesterday developed flulike symptoms with some body aches.  Had some nasal congestion.  Minimal cough.  Has been treating with over-the-counter medications.  He has had fairly severe sore throat.  Intermittent diaphoresis.  No dyspnea.  He has had COVID vaccines.  He has comorbidities including type 2 diabetes, history of obstructive sleep apnea, hypertension.  No known sick contacts.  His regular medications are reviewed.  Denies any nausea or vomiting.  No diarrhea.    ROS: See pertinent positives and negatives per HPI.  Past Medical History:  Diagnosis Date   Hypertension     Past Surgical History:  Procedure Laterality Date   REPLACEMENT TOTAL KNEE Left     No family history on file.  SOCIAL HX: Ex-smoker   Current Outpatient Medications:    amLODipine-olmesartan (AZOR) 10-40 MG tablet, TAKE 1 TABLET BY MOUTH DAILY, Disp: 90 tablet, Rfl: 3   aspirin 81 MG EC tablet, , Disp: , Rfl:    Insulin Pen Needle 32G X 4 MM MISC, Used to inject medication as directed., Disp: 30 each, Rfl: 2   metFORMIN (GLUCOPHAGE-XR) 500 MG 24 hr tablet, TAKE 4 TABLETS(2000 MG) BY  MOUTH DAILY, Disp: 360 tablet, Rfl: 3   molnupiravir EUA (LAGEVRIO) 200 mg CAPS capsule, Take 4 capsules (800 mg total) by mouth 2 (two) times daily for 5 days., Disp: 40 capsule, Rfl: 0   Multiple Vitamins-Minerals (MULTIVITAMIN MEN 50+) TABS, , Disp: , Rfl:    OZEMPIC, 0.25 OR 0.5 MG/DOSE, 2 MG/1.5ML SOPN, INJECT 0.5 MG UNDER THE SKIN ONCE A WEEK AS DIRECTED, Disp: 3 mL, Rfl: 0   rosuvastatin (CRESTOR) 10 MG tablet, TAKE 1 TABLET(10 MG) BY MOUTH DAILY, Disp: 90 tablet, Rfl: 3  EXAM:  VITALS per patient if applicable:  GENERAL: alert, oriented, appears well and in no acute distress  HEENT: atraumatic, conjunttiva clear, no obvious abnormalities on inspection of external nose and ears  NECK: normal movements of the head and neck  LUNGS: on inspection no signs of respiratory distress, breathing rate appears normal, no obvious gross SOB, gasping or wheezing  CV: no obvious cyanosis  MS: moves all visible extremities without noticeable abnormality  PSYCH/NEURO: pleasant and cooperative, no obvious depression or anxiety, speech and thought processing grossly intact  ASSESSMENT AND PLAN:  Discussed the following assessment and plan:   COVID-19 infection.  Patient does have comorbidities as above.  We discussed antiviral therapies.  We decided after discussion to start Molnupiravir 4 capsules twice daily for 5 days.  Plenty of fluids and rest.  He is aware of isolation precautions.  Follow-up for any increased dyspnea or other concerns    I discussed the assessment and treatment plan with the patient. The patient  was provided an opportunity to ask questions and all were answered. The patient agreed with the plan and demonstrated an understanding of the instructions.   The patient was advised to call back or seek an in-person evaluation if the symptoms worsen or if the condition fails to improve as anticipated.     Carolann Littler, MD

## 2020-11-16 LAB — HM DIABETES EYE EXAM

## 2020-12-16 LAB — HM DIABETES EYE EXAM

## 2020-12-21 LAB — HM DIABETES EYE EXAM

## 2021-01-11 LAB — HM DIABETES EYE EXAM

## 2021-01-25 ENCOUNTER — Encounter: Payer: Self-pay | Admitting: Family Medicine

## 2021-03-08 ENCOUNTER — Encounter: Payer: 59 | Admitting: Family Medicine

## 2021-03-24 ENCOUNTER — Ambulatory Visit (INDEPENDENT_AMBULATORY_CARE_PROVIDER_SITE_OTHER): Payer: 59 | Admitting: Family Medicine

## 2021-03-24 ENCOUNTER — Encounter: Payer: Self-pay | Admitting: Family Medicine

## 2021-03-24 ENCOUNTER — Other Ambulatory Visit: Payer: Self-pay

## 2021-03-24 VITALS — BP 142/91 | HR 68 | Temp 98.2°F | Ht 69.0 in | Wt 189.0 lb

## 2021-03-24 DIAGNOSIS — Z1211 Encounter for screening for malignant neoplasm of colon: Secondary | ICD-10-CM

## 2021-03-24 DIAGNOSIS — E1169 Type 2 diabetes mellitus with other specified complication: Secondary | ICD-10-CM | POA: Diagnosis not present

## 2021-03-24 DIAGNOSIS — Z125 Encounter for screening for malignant neoplasm of prostate: Secondary | ICD-10-CM

## 2021-03-24 DIAGNOSIS — M1990 Unspecified osteoarthritis, unspecified site: Secondary | ICD-10-CM | POA: Diagnosis not present

## 2021-03-24 DIAGNOSIS — L570 Actinic keratosis: Secondary | ICD-10-CM

## 2021-03-24 DIAGNOSIS — L989 Disorder of the skin and subcutaneous tissue, unspecified: Secondary | ICD-10-CM

## 2021-03-24 DIAGNOSIS — Z0001 Encounter for general adult medical examination with abnormal findings: Secondary | ICD-10-CM | POA: Diagnosis not present

## 2021-03-24 DIAGNOSIS — E785 Hyperlipidemia, unspecified: Secondary | ICD-10-CM | POA: Diagnosis not present

## 2021-03-24 DIAGNOSIS — I152 Hypertension secondary to endocrine disorders: Secondary | ICD-10-CM

## 2021-03-24 DIAGNOSIS — E1159 Type 2 diabetes mellitus with other circulatory complications: Secondary | ICD-10-CM

## 2021-03-24 LAB — COMPREHENSIVE METABOLIC PANEL
ALT: 31 U/L (ref 0–53)
AST: 26 U/L (ref 0–37)
Albumin: 4.8 g/dL (ref 3.5–5.2)
Alkaline Phosphatase: 66 U/L (ref 39–117)
BUN: 12 mg/dL (ref 6–23)
CO2: 28 mEq/L (ref 19–32)
Calcium: 9.8 mg/dL (ref 8.4–10.5)
Chloride: 98 mEq/L (ref 96–112)
Creatinine, Ser: 1.05 mg/dL (ref 0.40–1.50)
GFR: 75.24 mL/min (ref >60.00)
Glucose, Bld: 246 mg/dL — ABNORMAL HIGH (ref 70–99)
Potassium: 4.2 mEq/L (ref 3.5–5.1)
Sodium: 135 mEq/L (ref 135–145)
Total Bilirubin: 0.7 mg/dL (ref 0.2–1.2)
Total Protein: 7.7 g/dL (ref 6.0–8.3)

## 2021-03-24 LAB — LIPID PANEL
Cholesterol: 146 mg/dL (ref 0–200)
HDL: 36.4 mg/dL — ABNORMAL LOW (ref >39.00)
Total CHOL/HDL Ratio: 4
Triglycerides: 459 mg/dL — ABNORMAL HIGH (ref 0.0–149.0)

## 2021-03-24 LAB — CBC
HCT: 47.9 % (ref 39.0–52.0)
Hemoglobin: 16.5 g/dL (ref 13.0–17.0)
MCHC: 34.4 g/dL (ref 30.0–36.0)
MCV: 90.3 fl (ref 78.0–100.0)
Platelets: 177 10*3/uL (ref 150.0–400.0)
RBC: 5.3 Mil/uL (ref 4.22–5.81)
RDW: 12.9 % (ref 11.5–15.5)
WBC: 5.6 10*3/uL (ref 4.0–10.5)

## 2021-03-24 LAB — HEMOGLOBIN A1C: Hgb A1c MFr Bld: 11 % — ABNORMAL HIGH (ref 4.6–6.5)

## 2021-03-24 LAB — PSA: PSA: 0.33 ng/mL (ref 0.10–4.00)

## 2021-03-24 LAB — LDL CHOLESTEROL, DIRECT: Direct LDL: 53 mg/dL

## 2021-03-24 LAB — TSH: TSH: 4.81 u[IU]/mL (ref 0.35–5.50)

## 2021-03-24 MED ORDER — KETOCONAZOLE-HYDROCORTISONE 2-2.5 % EX CREA: TOPICAL_CREAM | CUTANEOUS | 0 refills | Status: DC

## 2021-03-24 NOTE — Assessment & Plan Note (Signed)
Slightly above goal today.  He has typically been well controlled.  Continue amlodipine-valsartan 10-40 once daily.  Continue home monitoring.  Follow-up 6 months.

## 2021-03-24 NOTE — Assessment & Plan Note (Signed)
He has not been on Ozempic.  He is currently on metformin 2000mg  daily.  Tolerating well.  Check A1c today.  May need to restart Ozempic if A1c is not at goal.

## 2021-03-24 NOTE — Progress Notes (Signed)
Chief Complaint:  Matthew Harris is a 66 y.o. male who presents today for his annual comprehensive physical exam.    Assessment/Plan:  New/Acute Problems: Left Shoulder Pain Consistent with rotator cuff tendinopathy.  We discussed home exercises and handout was given.  We also discussed starting prescription anti-inflammatory however he deferred for now.  He will continue taking over-the-counter medications let me know if not improving.  Skin Lesion Possibly actinic keratosis.  We discussed watchful waiting versus trial of cryotherapy.  Cryotherapy was applied today.  See below procedure note.  Dermatitis Will try ketoconazole-hydrocortisone cream.  He will follow-up with dermatology soon.  Chronic Problems Addressed Today: Osteoarthritis Continue management per orthopedics.  Dyslipidemia associated with type 2 diabetes mellitus (HCC) Check lipids.  He is on Crestor 10 mg daily.  Hypertension associated with diabetes (HCC) Slightly above goal today.  He has typically been well controlled.  Continue amlodipine-valsartan 10-40 once daily.  Continue home monitoring.  Follow-up 6 months.  DM type 2 with diabetic dyslipidemia (HCC) He has not been on Ozempic.  He is currently on metformin 2000mg  daily.  Tolerating well.  Check A1c today.  May need to restart Ozempic if A1c is not at goal.   Preventative Healthcare: Check labs.  Order Cologuard.  Up-to-date on vaccines.  Patient Counseling(The following topics were reviewed and/or handout was given):  -Nutrition: Stressed importance of moderation in sodium/caffeine intake, saturated fat and cholesterol, caloric balance, sufficient intake of fresh fruits, vegetables, and fiber.  -Stressed the importance of regular exercise.   -Substance Abuse: Discussed cessation/primary prevention of tobacco, alcohol, or other drug use; driving or other dangerous activities under the influence; availability of treatment for abuse.   -Injury prevention:  Discussed safety belts, safety helmets, smoke detector, smoking near bedding or upholstery.   -Sexuality: Discussed sexually transmitted diseases, partner selection, use of condoms, avoidance of unintended pregnancy and contraceptive alternatives.   -Dental health: Discussed importance of regular tooth brushing, flossing, and dental visits.  -Health maintenance and immunizations reviewed. Please refer to Health maintenance section.  Return to care in 1 year for next preventative visit.     Subjective:  HPI:  See A/P for status of chronic conditions  He is also had pain in his left shoulder for the last few months.  Recently started golfing more think this could be contributing..  Is worse with certain motions.  Improves with Tylenol.  No weakness or numbness.  He has also had a lesion on his right second toe for the last 2 months.  Will sometimes bleed.  Sometimes itches.  He has tried using antibiotic ointment to the area with no improvement.  Lifestyle Diet: Cutting out sugar.  Exercise: Exercises daily.   Depression screen PHQ 2/9 01/11/2020  Decreased Interest 0  Down, Depressed, Hopeless 0  PHQ - 2 Score 0    Health Maintenance Due  Topic Date Due   FOOT EXAM  Never done   HEMOGLOBIN A1C  07/11/2020     ROS: Per HPI, otherwise a complete review of systems was negative.   PMH:  The following were reviewed and entered/updated in epic: Past Medical History:  Diagnosis Date   Hypertension    Patient Active Problem List   Diagnosis Date Noted   Osteoarthritis 07/13/2019   OSA on CPAP 03/09/2019   DM type 2 with diabetic dyslipidemia (HCC) 06/07/2016   Hypertension associated with diabetes (HCC) 11/15/2011   Dyslipidemia associated with type 2 diabetes mellitus (HCC) 11/15/2011   Past Surgical  History:  Procedure Laterality Date   REPLACEMENT TOTAL KNEE Left     History reviewed. No pertinent family history.  Medications- reviewed and updated Current  Outpatient Medications  Medication Sig Dispense Refill   amLODipine-olmesartan (AZOR) 10-40 MG tablet TAKE 1 TABLET BY MOUTH DAILY 90 tablet 3   aspirin 81 MG EC tablet      Ketoconazole-Hydrocortisone 2-2.5 % CREA Apply twice daily. 60 g 0   metFORMIN (GLUCOPHAGE-XR) 500 MG 24 hr tablet TAKE 4 TABLETS(2000 MG) BY MOUTH DAILY 360 tablet 3   Multiple Vitamins-Minerals (MULTIVITAMIN MEN 50+) TABS      rosuvastatin (CRESTOR) 10 MG tablet TAKE 1 TABLET(10 MG) BY MOUTH DAILY 90 tablet 3   No current facility-administered medications for this visit.    Allergies-reviewed and updated No Known Allergies  Social History   Socioeconomic History   Marital status: Single    Spouse name: Not on file   Number of children: Not on file   Years of education: Not on file   Highest education level: Not on file  Occupational History   Not on file  Tobacco Use   Smoking status: Former    Types: Cigarettes    Quit date: 07/13/1990    Years since quitting: 30.7   Smokeless tobacco: Never  Vaping Use   Vaping Use: Never used  Substance and Sexual Activity   Alcohol use: Yes   Drug use: Never   Sexual activity: Not on file  Other Topics Concern   Not on file  Social History Narrative   Not on file   Social Determinants of Health   Financial Resource Strain: Not on file  Food Insecurity: Not on file  Transportation Needs: Not on file  Physical Activity: Not on file  Stress: Not on file  Social Connections: Not on file        Objective:  Physical Exam: BP (!) 142/91 (BP Location: Left Arm)    Pulse 68    Temp 98.2 F (36.8 C) (Temporal)    Ht 5\' 9"  (1.753 m)    Wt 189 lb (85.7 kg)    SpO2 98%    BMI 27.91 kg/m   Body mass index is 27.91 kg/m. Wt Readings from Last 3 Encounters:  03/24/21 189 lb (85.7 kg)  01/11/20 193 lb (87.5 kg)  07/13/19 195 lb 4 oz (88.6 kg)   Gen: NAD, resting comfortably HEENT: TMs normal bilaterally. OP clear. No thyromegaly noted.  CV: RRR with no  murmurs appreciated Pulm: NWOB, CTAB with no crackles, wheezes, or rhonchi GI: Normal bowel sounds present. Soft, Nontender, Nondistended. MSK: no edema, cyanosis, or clubbing noted Skin: warm, dry.  Approximately 4 mm erythematous lesion on right second toe with central excoriation. Neuro: CN2-12 grossly intact. Strength 5/5 in upper and lower extremities. Reflexes symmetric and intact bilaterally.  Psych: Normal affect and thought content  Cryotherapy Procedure Note  Pre-operative Diagnosis: Suspicious lesion  Locations: Right second toe  Indications: Therapeutic  Procedure Details  Patient informed of risks (permanent scarring, infection, light or dark discoloration, bleeding, infection, weakness, numbness and recurrence of the lesion) and benefits of the procedure and verbal informed consent obtained.  The areas are treated with liquid nitrogen therapy, frozen until ice ball extended 3 mm beyond lesion, allowed to thaw, and treated again. The patient tolerated procedure well.  The patient was instructed on post-op care, warned that there may be blister formation, redness and pain. Recommend OTC analgesia as needed for pain.  Condition: Stable  Complications: none.      Katina Degree. [name], MD 03/24/2021 8:50 AM

## 2021-03-24 NOTE — Assessment & Plan Note (Signed)
Check lipids.  He is on Crestor 10 mg daily. 

## 2021-03-24 NOTE — Assessment & Plan Note (Signed)
Continue management per orthopedics. 

## 2021-03-24 NOTE — Patient Instructions (Signed)
It was very nice to see you today!  Please work on the exercises for your shoulder.  We froze the spot on your toe today.  Please let us know if its not getting better.  You can try the cream for the spots on your head and side though these will probably need to be frozen by your dermatologist.  We will check blood work today.  We will get you set up for cologuard as well.  We will see you back in 6 months depending on the results of your blood work.  I will see back in year for your next physical.  Please come back to see [place] sooner if needed.  Take care, Dr [name]  PLEASE NOTE:  If you had any lab tests please let us know if you have not heard back within a few days. You may see your results on mychart before we have a chance to review them but we will give you a call once they are reviewed by [place]. If we ordered any referrals today, please let us know if you have not heard from their office within the next week.   Please try these tips to maintain a healthy lifestyle:  Eat at least 3 REAL meals and 1-2 snacks per day.  Aim for no more than 5 hours between eating.  If you eat breakfast, please do so within one hour of getting up.   Each meal should contain half fruits/vegetables, one quarter protein, and one quarter carbs (no bigger than a computer mouse)  Cut down on sweet beverages. This includes juice, soda, and sweet tea.   Drink at least 1 glass of water with each meal and aim for at least 8 glasses per day  Exercise at least 150 minutes every week.    Preventive Care 64-48 Years Old, Male Preventive care refers to lifestyle choices and visits with your health care provider that can promote health and wellness. Preventive care visits are also called wellness exams. What can I expect for my preventive care visit? Counseling During your preventive care visit, your health care provider may ask about your: Medical history, including: Past medical problems. Family medical  history. Current health, including: Emotional well-being. Home life and relationship well-being. Sexual activity. Lifestyle, including: Alcohol, nicotine or tobacco, and drug use. Access to firearms. Diet, exercise, and sleep habits. Safety issues such as seatbelt and bike helmet use. Sunscreen use. Work and work Astronomer. Physical exam Your health care provider will check your: Height and weight. These may be used to calculate your BMI (body mass index). BMI is a measurement that tells if you are at a healthy weight. Waist circumference. This measures the distance around your waistline. This measurement also tells if you are at a healthy weight and may help predict your risk of certain diseases, such as type 2 diabetes and high blood pressure. Heart rate and blood pressure. Body temperature. Skin for abnormal spots. What immunizations do I need? Vaccines are usually given at various ages, according to a schedule. Your health care provider will recommend vaccines for you based on your age, medical history, and lifestyle or other factors, such as travel or where you work. What tests do I need? Screening Your health care provider may recommend screening tests for certain conditions. This may include: Lipid and cholesterol levels. Diabetes screening. This is done by checking your blood sugar (glucose) after you have not eaten for a while (fasting). Hepatitis B test. Hepatitis C test. HIV (human immunodeficiency  virus) test. STI (sexually transmitted infection) testing, if you are at risk. Lung cancer screening. Prostate cancer screening. Colorectal cancer screening. Talk with your health care provider about your test results, treatment options, and if necessary, the need for more tests. Follow these instructions at home: Eating and drinking  Eat a diet that includes fresh fruits and vegetables, whole grains, lean protein, and low-fat dairy products. Take vitamin and mineral  supplements as recommended by your health care provider. Do not drink alcohol if your health care provider tells you not to drink. If you drink alcohol: Limit how much you have to 0-2 drinks a day. Know how much alcohol is in your drink. In the [place] one drink equals one 12 oz bottle of beer (355 mL), one 5 oz glass of wine (148 mL), or one 1 oz glass of hard liquor (44 mL). Lifestyle Brush your teeth every morning and night with fluoride toothpaste. Floss one time each day. Exercise for at least 30 minutes 5 or more days each week. Do not use any products that contain nicotine or tobacco. These products include cigarettes, chewing tobacco, and vaping devices, such as e-cigarettes. If you need help quitting, ask your health care provider. Do not use drugs. If you are sexually active, practice safe sex. Use a condom or other form of protection to prevent STIs. Take aspirin only as told by your health care provider. Make sure that you understand how much to take and what form to take. Work with your health care provider to find out whether it is safe and beneficial for you to take aspirin daily. Find healthy ways to manage stress, such as: Meditation, yoga, or listening to music. Journaling. Talking to a trusted person. Spending time with friends and family. Minimize exposure to UV radiation to reduce your risk of skin cancer. Safety Always wear your seat belt while driving or riding in a vehicle. Do not drive: If you have been drinking alcohol. Do not ride with someone who has been drinking. When you are tired or distracted. While texting. If you have been using any mind-altering substances or drugs. Wear a helmet and other protective equipment during sports activities. If you have firearms in your house, make sure you follow all gun safety procedures. What's next? Go to your health care provider once a year for an annual wellness visit. Ask your health care provider how often you should  have your eyes and teeth checked. Stay up to date on all vaccines. This information is not intended to replace advice given to you by your health care provider. Make sure you discuss any questions you have with your health care provider. Document Revised: 07/13/2020 Document Reviewed: 07/13/2020 Elsevier Patient Education  2022 [employer].

## 2021-03-28 ENCOUNTER — Other Ambulatory Visit: Payer: Self-pay

## 2021-03-28 ENCOUNTER — Encounter: Payer: Self-pay | Admitting: Family Medicine

## 2021-03-28 MED ORDER — HYDROCORTISONE 2.5 % EX CREA: TOPICAL_CREAM | Freq: Two times a day (BID) | CUTANEOUS | 0 refills | Status: DC

## 2021-03-28 MED ORDER — KETOCONAZOLE 2 % EX CREA: 1.0000 "application " | TOPICAL_CREAM | Freq: Every day | CUTANEOUS | 0 refills | Status: DC

## 2021-03-28 NOTE — Progress Notes (Signed)
Please inform patient of the following:  His A1c is very elevated. Everything else is stable. We need to start him on an additional medication for his diabetes. Recommend starting ozempic 0.25mg  daily for 4 weeks and then increasing to 0.5mg  weekly. He needs to come back in 3 months to recheck A1c.  Katina Degree. [name], MD 03/28/2021 10:00 AM

## 2021-03-31 ENCOUNTER — Other Ambulatory Visit: Payer: Self-pay

## 2021-03-31 MED ORDER — OZEMPIC (0.25 OR 0.5 MG/DOSE) 2 MG/1.5ML ~~LOC~~ SOPN: 0.2500 mg | PEN_INJECTOR | SUBCUTANEOUS | 0 refills | Status: DC

## 2021-04-06 LAB — COLOGUARD: COLOGUARD: NEGATIVE

## 2021-04-07 ENCOUNTER — Telehealth: Payer: Self-pay | Admitting: Family Medicine

## 2021-04-07 NOTE — Telephone Encounter (Signed)
Pt returning a call regarding his lab results. Please call back. ?

## 2021-04-07 NOTE — Telephone Encounter (Signed)
See lab note.  

## 2021-04-07 NOTE — Progress Notes (Signed)
Please inform patient of the following:  Good news! Cologuard is negative. We can recheck in 3 years.  Matthew Harris. [name], MD 04/07/2021 8:27 AM

## 2021-05-01 ENCOUNTER — Other Ambulatory Visit: Payer: Self-pay | Admitting: Family Medicine

## 2021-05-26 ENCOUNTER — Encounter: Payer: Self-pay | Admitting: Family Medicine

## 2021-07-03 ENCOUNTER — Ambulatory Visit: Payer: 59 | Admitting: Family Medicine

## 2021-07-04 ENCOUNTER — Telehealth: Payer: Self-pay | Admitting: Family Medicine

## 2021-07-04 ENCOUNTER — Encounter: Payer: Self-pay | Admitting: Family Medicine

## 2021-07-04 ENCOUNTER — Ambulatory Visit: Payer: 59 | Admitting: Family Medicine

## 2021-07-04 VITALS — BP 122/81 | HR 79 | Temp 97.8°F | Ht 69.0 in | Wt 187.4 lb

## 2021-07-04 DIAGNOSIS — I10 Essential (primary) hypertension: Secondary | ICD-10-CM

## 2021-07-04 DIAGNOSIS — E1159 Type 2 diabetes mellitus with other circulatory complications: Secondary | ICD-10-CM | POA: Diagnosis not present

## 2021-07-04 DIAGNOSIS — E785 Hyperlipidemia, unspecified: Secondary | ICD-10-CM | POA: Diagnosis not present

## 2021-07-04 DIAGNOSIS — E1169 Type 2 diabetes mellitus with other specified complication: Secondary | ICD-10-CM | POA: Diagnosis not present

## 2021-07-04 LAB — POCT GLYCOSYLATED HEMOGLOBIN (HGB A1C): Hemoglobin A1C: 9.7 % — AB (ref 4.0–5.6)

## 2021-07-04 MED ORDER — AMLODIPINE-OLMESARTAN 10-40 MG PO TABS: 1.0000 | ORAL_TABLET | Freq: Every day | ORAL | 3 refills | Status: DC

## 2021-07-04 MED ORDER — OZEMPIC (0.25 OR 0.5 MG/DOSE) 2 MG/1.5ML ~~LOC~~ SOPN: 0.5000 mg | PEN_INJECTOR | SUBCUTANEOUS | 0 refills | Status: DC

## 2021-07-04 NOTE — Telephone Encounter (Signed)
Pt calls stating Semaglutide,0.25 or 0.5MG /DOS, (OZEMPIC, 0.25 OR 0.5 MG/DOSE,) 2 MG/1.5ML SOPN was about $100 out of pocket at the pharmacy. Upon calling his insurance he was able to find that a coupon could be provided upon Dr. [name] calling [phone] and explaining why the medication was needed. Although this is an option, he found it would only be a valid option for 3 months. Pt states he was told that he could receive a coupon for trulicity that could make this medication $30 for at least a year. With this information he would like to transition back to using trulicity and have the process for the coupon completed. Please Advise.

## 2021-07-04 NOTE — Assessment & Plan Note (Addendum)
At goal today on amlodipine-valsartan 10-40 once daily.  Follow-up in 3 months.

## 2021-07-04 NOTE — Assessment & Plan Note (Signed)
A1c improved though still above goal at 9.7.  He will increase Ozempic to 0.5 mg weekly.  He has only been taking 0.25 mg weekly for the last few weeks.  We discussed lifestyle modifications.  He will also continue metformin 2000 mg daily.  He will check with his insurance to see if there are any other more medications that may be more affordable than Ozempic.  We can recheck A1c in 3 months.

## 2021-07-04 NOTE — Patient Instructions (Addendum)
It was very nice to see you today!  Please increase your ozempic to 0.5mg  weekly.   Please ask insurance about getting a formulary to see what diabetes meds are covered.  Please get down the sugar we can.  I will see you back in 3 months.  Come back sooner if needed.  Take care, Dr [name]  PLEASE NOTE:  If you had any lab tests please let us know if you have not heard back within a few days. You may see your results on mychart before we have a chance to review them but we will give you a call once they are reviewed by [place]. If we ordered any referrals today, please let us know if you have not heard from their office within the next week.   Please try these tips to maintain a healthy lifestyle:  Eat at least 3 REAL meals and 1-2 snacks per day.  Aim for no more than 5 hours between eating.  If you eat breakfast, please do so within one hour of getting up.   Each meal should contain half fruits/vegetables, one quarter protein, and one quarter carbs (no bigger than a computer mouse)  Cut down on sweet beverages. This includes juice, soda, and sweet tea.   Drink at least 1 glass of water with each meal and aim for at least 8 glasses per day  Exercise at least 150 minutes every week.

## 2021-07-04 NOTE — Progress Notes (Signed)
   Matthew Harris is a 66 y.o. male who presents today for an office visit.  Assessment/Plan:  Chronic Problems Addressed Today: Hypertension associated with diabetes (HCC) At goal today on amlodipine-valsartan 10-40 once daily.  Follow-up in 3 months.  DM type 2 with diabetic dyslipidemia (HCC) A1c improved though still above goal at 9.7.  He will increase Ozempic to 0.5 mg weekly.  He has only been taking 0.25 mg weekly for the last few weeks.  We discussed lifestyle modifications.  He will also continue metformin 2000 mg daily.  He will check with his insurance to see if there are any other more medications that may be more affordable than Ozempic.  We can recheck A1c in 3 months.   Subjective:  HPI:  See a/p for status of chronic conditions.        Objective:  Physical Exam: BP 122/81   Pulse 79   Temp 97.8 F (36.6 C) (Temporal)   Ht 5\' 9"  (1.753 m)   Wt 187 lb 6.4 oz (85 kg)   SpO2 97%   BMI 27.67 kg/m   Gen: No acute distress, resting comfortably CV: Regular rate and rhythm with no murmurs appreciated Pulm: Normal work of breathing, clear to auscultation bilaterally with no crackles, wheezes, or rhonchi Neuro: Grossly normal, moves all extremities Psych: Normal affect and thought content      Digby Groeneveld M. , MD 07/04/2021 9:30 AM

## 2021-07-05 NOTE — Telephone Encounter (Signed)
Patient requesting change medication to Trulicity  See note

## 2021-07-10 NOTE — Telephone Encounter (Signed)
Ok to send in trulicity 0.75mg  weekly for 4 weeks and then increase to 1.5mg  weekly until he follows up with [place] in 3 months.  Katina Degree. [name], MD 07/10/2021 4:50 PM

## 2021-07-11 ENCOUNTER — Other Ambulatory Visit: Payer: Self-pay | Admitting: *Deleted

## 2021-07-11 MED ORDER — TRULICITY 0.75 MG/0.5ML ~~LOC~~ SOAJ: 0.7500 mg | SUBCUTANEOUS | 2 refills | Status: DC

## 2021-07-11 NOTE — Telephone Encounter (Signed)
Spoke with patient. Stated pick up  Rx Ozempic with try for 4 week will pick up Trulicity after finish his Ozempic

## 2021-09-08 ENCOUNTER — Telehealth: Payer: Self-pay | Admitting: Family Medicine

## 2021-09-08 NOTE — Telephone Encounter (Signed)
Patient states: - Upon picking up trulicity he found that medication was the same price as the ozempic.  - He informed pharmacy he would like to have ozempic refilled instead of continuing with trulicity.  - Pharmacy sent over two refill requests for the ozempic on 08/11.   Patient requests:  - Medication be refilled as soon as possible   I informed patient I was unable to see that refill request from the pharmacy but recommended he give it some time to process. Pt will callback later in the day to see if request has been received.

## 2021-10-09 ENCOUNTER — Ambulatory Visit: Payer: 59 | Admitting: Family Medicine

## 2021-10-09 ENCOUNTER — Encounter: Payer: Self-pay | Admitting: Family Medicine

## 2021-10-09 ENCOUNTER — Telehealth: Payer: Self-pay | Admitting: Family Medicine

## 2021-10-09 VITALS — BP 135/82 | HR 70 | Temp 98.0°F | Ht 69.0 in | Wt 185.8 lb

## 2021-10-09 DIAGNOSIS — E785 Hyperlipidemia, unspecified: Secondary | ICD-10-CM | POA: Diagnosis not present

## 2021-10-09 DIAGNOSIS — I10 Essential (primary) hypertension: Secondary | ICD-10-CM | POA: Diagnosis not present

## 2021-10-09 DIAGNOSIS — E1169 Type 2 diabetes mellitus with other specified complication: Secondary | ICD-10-CM

## 2021-10-09 DIAGNOSIS — E1159 Type 2 diabetes mellitus with other circulatory complications: Secondary | ICD-10-CM

## 2021-10-09 LAB — POCT GLYCOSYLATED HEMOGLOBIN (HGB A1C): Hemoglobin A1C: 10.9 % — AB (ref 4.0–5.6)

## 2021-10-09 MED ORDER — RYBELSUS 7 MG PO TABS: 7.0000 mg | ORAL_TABLET | Freq: Every day | ORAL | 3 refills | Status: DC

## 2021-10-09 MED ORDER — RYBELSUS 3 MG PO TABS: 3.0000 mg | ORAL_TABLET | Freq: Every day | ORAL | 0 refills | Status: DC

## 2021-10-09 NOTE — Addendum Note (Signed)
Addended by: Dyann Kief on: 10/09/2021 09:32 AM   Modules accepted: Orders

## 2021-10-09 NOTE — Assessment & Plan Note (Signed)
A1c not controlled at 10.9.  He was unable to take the Trulicity due to side effects and was not able to restart Ozempic.  He is currently on metformin 2000 mg daily.  We discussed treatment options including going back to Ozempic versus alternative.  He would like to try Rybelsus.  We will send a prescription for 3 mg daily for 30 days and then increase to 7 mg daily.  We discussed lifestyle modifications.  We will recheck A1c in 3 months. May consider mounjaro if not at goal on metformin and rybelsus.

## 2021-10-09 NOTE — Patient Instructions (Addendum)
It was very nice to see you today!  Please start rybelsus.  Keep an eye on your blood pressure and let me know if it is persistently elevated  Please make sure you enroll into a medicare plan next month. [employer] is our local insurance provider.  I will see you back in a few months for your annual physical.  We can check labs at that point.  Come back sooner if needed.  Take care, Dr [name]  PLEASE NOTE:  If you had any lab tests please let us know if you have not heard back within a few days. You may see your results on mychart before we have a chance to review them but we will give you a call once they are reviewed by [place]. If we ordered any referrals today, please let us know if you have not heard from their office within the next week.   Please try these tips to maintain a healthy lifestyle:  Eat at least 3 REAL meals and 1-2 snacks per day.  Aim for no more than 5 hours between eating.  If you eat breakfast, please do so within one hour of getting up.   Each meal should contain half fruits/vegetables, one quarter protein, and one quarter carbs (no bigger than a computer mouse)  Cut down on sweet beverages. This includes juice, soda, and sweet tea.   Drink at least 1 glass of water with each meal and aim for at least 8 glasses per day  Exercise at least 150 minutes every week.

## 2021-10-09 NOTE — Assessment & Plan Note (Signed)
Stable on amlodipine-valsartan 10-40 once daily.  Continue home monitoring.  We will follow-up again at next office visit.

## 2021-10-09 NOTE — Telephone Encounter (Signed)
Pt states: -CoPay for Rybelsus is cost prohibitive.   Pt Request: -Cancellation of Rybelsus Rx -Sending Ozempic Rx to pharmacy.     [id number] - [place], [place] - [address] [place] [address] AT [facility] [address]  [address], [place] [address]  Phone:  [phone]  Fax:  [phone]  DEA #:  [id number]

## 2021-10-09 NOTE — Progress Notes (Signed)
   Matthew Harris is a 66 y.o. male who presents today for an office visit.  Assessment/Plan:  Chronic Problems Addressed Today: DM type 2 with diabetic dyslipidemia (HCC) A1c not controlled at 10.9.  He was unable to take the Trulicity due to side effects and was not able to restart Ozempic.  He is currently on metformin 2000 mg daily.  We discussed treatment options including going back to Ozempic versus alternative.  He would like to try Rybelsus.  We will send a prescription for 3 mg daily for 30 days and then increase to 7 mg daily.  We discussed lifestyle modifications.  We will recheck A1c in 3 months. May consider mounjaro if not at goal on metformin and rybelsus.   Hypertension associated with diabetes (HCC) Stable on amlodipine-valsartan 10-40 once daily.  Continue home monitoring.  We will follow-up again at next office visit.    Subjective:  HPI:  See A/p for status of chronic conditions.         Objective:  Physical Exam: BP 135/82   Pulse 70   Temp 98 F (36.7 C) (Temporal)   Ht 5\' 9"  (1.753 m)   Wt 185 lb 12.8 oz (84.3 kg)   SpO2 98%   BMI 27.44 kg/m   Gen: No acute distress, resting comfortably CV: Regular rate and rhythm with no murmurs appreciated Pulm: Normal work of breathing, clear to auscultation bilaterally with no crackles, wheezes, or rhonchi Neuro: Grossly normal, moves all extremities Psych: Normal affect and thought content      Random Dobrowski M. , MD 10/09/2021 8:44 AM

## 2021-10-11 NOTE — Telephone Encounter (Signed)
Please advise 

## 2021-10-12 NOTE — Telephone Encounter (Signed)
Please send in ozempic 0.25mg  weekly for 4 weeks then increase to 0.5mg  weekly.

## 2021-10-16 ENCOUNTER — Other Ambulatory Visit: Payer: Self-pay | Admitting: *Deleted

## 2021-10-16 MED ORDER — OZEMPIC (0.25 OR 0.5 MG/DOSE) 2 MG/3ML ~~LOC~~ SOPN: 0.2500 mg | PEN_INJECTOR | SUBCUTANEOUS | 1 refills | Status: DC

## 2021-10-16 NOTE — Telephone Encounter (Signed)
Rx Ozempic send to [facility]

## 2021-10-26 ENCOUNTER — Other Ambulatory Visit: Payer: Self-pay | Admitting: Family Medicine

## 2021-12-14 ENCOUNTER — Other Ambulatory Visit: Payer: Self-pay | Admitting: Family Medicine

## 2021-12-16 ENCOUNTER — Other Ambulatory Visit: Payer: Self-pay | Admitting: Family Medicine

## 2022-03-20 ENCOUNTER — Telehealth: Payer: Self-pay | Admitting: *Deleted

## 2022-03-20 NOTE — Telephone Encounter (Signed)
Request Reference Number: [id number]. OZEMPIC INJ 2MG/3ML is approved through 01/29/2023. Pharmacy notified

## 2022-03-20 NOTE — Telephone Encounter (Signed)
(  [facility] [id number] Ozempic (0.25 or 0.5 MG/DOSE) 2MG/3ML pen-injectors Status: PA Request Sent: February 20th, 2024 Waiting for determination

## 2022-03-28 ENCOUNTER — Ambulatory Visit (INDEPENDENT_AMBULATORY_CARE_PROVIDER_SITE_OTHER)
Admission: RE | Admit: 2022-03-28 | Discharge: 2022-03-28 | Disposition: A | Discharge status: Home / Self Care | Payer: Medicare Other | Source: Ambulatory Visit | Attending: Family Medicine | Admitting: Family Medicine

## 2022-03-28 ENCOUNTER — Encounter: Payer: Self-pay | Admitting: Family Medicine

## 2022-03-28 ENCOUNTER — Ambulatory Visit (INDEPENDENT_AMBULATORY_CARE_PROVIDER_SITE_OTHER): Payer: Medicare Other | Admitting: Family Medicine

## 2022-03-28 VITALS — BP 122/78 | HR 77 | Temp 98.0°F | Ht 69.0 in | Wt 186.2 lb

## 2022-03-28 DIAGNOSIS — Z0001 Encounter for general adult medical examination with abnormal findings: Secondary | ICD-10-CM | POA: Diagnosis not present

## 2022-03-28 DIAGNOSIS — R351 Nocturia: Secondary | ICD-10-CM

## 2022-03-28 DIAGNOSIS — E785 Hyperlipidemia, unspecified: Secondary | ICD-10-CM | POA: Diagnosis not present

## 2022-03-28 DIAGNOSIS — M25512 Pain in left shoulder: Secondary | ICD-10-CM

## 2022-03-28 DIAGNOSIS — M19012 Primary osteoarthritis, left shoulder: Secondary | ICD-10-CM | POA: Diagnosis not present

## 2022-03-28 DIAGNOSIS — M1990 Unspecified osteoarthritis, unspecified site: Secondary | ICD-10-CM

## 2022-03-28 DIAGNOSIS — G8929 Other chronic pain: Secondary | ICD-10-CM

## 2022-03-28 DIAGNOSIS — E1159 Type 2 diabetes mellitus with other circulatory complications: Secondary | ICD-10-CM | POA: Diagnosis not present

## 2022-03-28 DIAGNOSIS — I152 Hypertension secondary to endocrine disorders: Secondary | ICD-10-CM

## 2022-03-28 DIAGNOSIS — E1169 Type 2 diabetes mellitus with other specified complication: Secondary | ICD-10-CM | POA: Diagnosis not present

## 2022-03-28 LAB — COMPREHENSIVE METABOLIC PANEL
ALT: 27 U/L (ref 0–53)
AST: 23 U/L (ref 0–37)
Albumin: 4.4 g/dL (ref 3.5–5.2)
Alkaline Phosphatase: 69 U/L (ref 39–117)
BUN: 16 mg/dL (ref 6–23)
CO2: 23 mEq/L (ref 19–32)
Calcium: 9.6 mg/dL (ref 8.4–10.5)
Chloride: 99 mEq/L (ref 96–112)
Creatinine, Ser: 1.05 mg/dL (ref 0.40–1.50)
GFR: 74.71 mL/min (ref >60.00)
Glucose, Bld: 174 mg/dL — ABNORMAL HIGH (ref 70–99)
Potassium: 4.5 mEq/L (ref 3.5–5.1)
Sodium: 135 mEq/L (ref 135–145)
Total Bilirubin: 0.5 mg/dL (ref 0.2–1.2)
Total Protein: 7.3 g/dL (ref 6.0–8.3)

## 2022-03-28 LAB — LIPID PANEL
Cholesterol: 140 mg/dL (ref 0–200)
HDL: 38.3 mg/dL — ABNORMAL LOW (ref >39.00)
Total CHOL/HDL Ratio: 4
Triglycerides: 481 mg/dL — ABNORMAL HIGH (ref 0.0–149.0)

## 2022-03-28 LAB — CBC
HCT: 48.9 % (ref 39.0–52.0)
Hemoglobin: 16.4 g/dL (ref 13.0–17.0)
MCHC: 33.5 g/dL (ref 30.0–36.0)
MCV: 93.5 fl (ref 78.0–100.0)
Platelets: 202 10*3/uL (ref 150.0–400.0)
RBC: 5.22 Mil/uL (ref 4.22–5.81)
RDW: 13.2 % (ref 11.5–15.5)
WBC: 5.7 10*3/uL (ref 4.0–10.5)

## 2022-03-28 LAB — PSA: PSA: 0.49 ng/mL (ref 0.10–4.00)

## 2022-03-28 LAB — TSH: TSH: 4.41 u[IU]/mL (ref 0.35–5.50)

## 2022-03-28 LAB — HEMOGLOBIN A1C: Hgb A1c MFr Bld: 11.4 % — ABNORMAL HIGH (ref 4.6–6.5)

## 2022-03-28 LAB — LDL CHOLESTEROL, DIRECT: Direct LDL: 54 mg/dL

## 2022-03-28 NOTE — Assessment & Plan Note (Signed)
He only recently was able to start Ozempic due to stock issues with pharmacy.  He is back on 0.5 mg with this for last 2 weeks.  Is also been compliant with his metformin 2000 mg total daily.  Recheck A1c today.  Will probably come back in 3 months to recheck A1c as it would likely not be at goal due to just restarting his Ozempic.

## 2022-03-28 NOTE — Patient Instructions (Signed)
It was very nice to see you today!  You probably have an inflamed rotator cuff.  Please work on exercises.  We will check an x-ray to look for arthritis.  Will check blood work today.  Please continue to work on diet and exercise.  Will probably see back in a few months depending on results your blood work.  Take care, Dr Jerline Pain  PLEASE NOTE:  If you had any lab tests, please let us know if you have not heard back within a few days. You may see your results on mychart before we have a chance to review them but we will give you a call once they are reviewed by [place].   If we ordered any referrals today, please let us know if you have not heard from their office within the next week.   If you had any urgent prescriptions sent in today, please check with the pharmacy within an hour of our visit to make sure the prescription was transmitted appropriately.   Please try these tips to maintain a healthy lifestyle:  Eat at least 3 REAL meals and 1-2 snacks per day.  Aim for no more than 5 hours between eating.  If you eat breakfast, please do so within one hour of getting up.   Each meal should contain half fruits/vegetables, one quarter protein, and one quarter carbs (no bigger than a computer mouse)  Cut down on sweet beverages. This includes juice, soda, and sweet tea.   Drink at least 1 glass of water with each meal and aim for at least 8 glasses per day  Exercise at least 150 minutes every week.    Preventive Care 65-22 Years Old, Male Preventive care refers to lifestyle choices and visits with your health care provider that can promote health and wellness. Preventive care visits are also called wellness exams. What can I expect for my preventive care visit? Counseling During your preventive care visit, your health care provider may ask about your: Medical history, including: Past medical problems. Family medical history. Current health, including: Emotional well-being. Home life  and relationship well-being. Sexual activity. Lifestyle, including: Alcohol, nicotine or tobacco, and drug use. Access to firearms. Diet, exercise, and sleep habits. Safety issues such as seatbelt and bike helmet use. Sunscreen use. Work and work Statistician. Physical exam Your health care provider will check your: Height and weight. These may be used to calculate your BMI (body mass index). BMI is a measurement that tells if you are at a healthy weight. Waist circumference. This measures the distance around your waistline. This measurement also tells if you are at a healthy weight and may help predict your risk of certain diseases, such as type 2 diabetes and high blood pressure. Heart rate and blood pressure. Body temperature. Skin for abnormal spots. What immunizations do I need?  Vaccines are usually given at various ages, according to a schedule. Your health care provider will recommend vaccines for you based on your age, medical history, and lifestyle or other factors, such as travel or where you work. What tests do I need? Screening Your health care provider may recommend screening tests for certain conditions. This may include: Lipid and cholesterol levels. Diabetes screening. This is done by checking your blood sugar (glucose) after you have not eaten for a while (fasting). Hepatitis B test. Hepatitis C test. HIV (human immunodeficiency virus) test. STI (sexually transmitted infection) testing, if you are at risk. Lung cancer screening. Prostate cancer screening. Colorectal cancer screening. Talk with  your health care provider about your test results, treatment options, and if necessary, the need for more tests. Follow these instructions at home: Eating and drinking  Eat a diet that includes fresh fruits and vegetables, whole grains, lean protein, and low-fat dairy products. Take vitamin and mineral supplements as recommended by your health care provider. Do not drink  alcohol if your health care provider tells you not to drink. If you drink alcohol: Limit how much you have to 0-2 drinks a day. Know how much alcohol is in your drink. In the [place] one drink equals one 12 oz bottle of beer (355 mL), one 5 oz glass of wine (148 mL), or one 1 oz glass of hard liquor (44 mL). Lifestyle Brush your teeth every morning and night with fluoride toothpaste. Floss one time each day. Exercise for at least 30 minutes 5 or more days each week. Do not use any products that contain nicotine or tobacco. These products include cigarettes, chewing tobacco, and vaping devices, such as e-cigarettes. If you need help quitting, ask your health care provider. Do not use drugs. If you are sexually active, practice safe sex. Use a condom or other form of protection to prevent STIs. Take aspirin only as told by your health care provider. Make sure that you understand how much to take and what form to take. Work with your health care provider to find out whether it is safe and beneficial for you to take aspirin daily. Find healthy ways to manage stress, such as: Meditation, yoga, or listening to music. Journaling. Talking to a trusted person. Spending time with friends and family. Minimize exposure to UV radiation to reduce your risk of skin cancer. Safety Always wear your seat belt while driving or riding in a vehicle. Do not drive: If you have been drinking alcohol. Do not ride with someone who has been drinking. When you are tired or distracted. While texting. If you have been using any mind-altering substances or drugs. Wear a helmet and other protective equipment during sports activities. If you have firearms in your house, make sure you follow all gun safety procedures. What's next? Go to your health care provider once a year for an annual wellness visit. Ask your health care provider how often you should have your eyes and teeth checked. Stay up to date on all  vaccines. This information is not intended to replace advice given to you by your health care provider. Make sure you discuss any questions you have with your health care provider. Document Revised: 07/13/2020 Document Reviewed: 07/13/2020 Elsevier Patient Education  [place].

## 2022-03-28 NOTE — Progress Notes (Signed)
Chief Complaint:  Matthew Harris is a 67 y.o. male who presents today for his annual comprehensive physical exam.    Assessment/Plan:  New/Acute Problems: Left Shoulder Pain Likely rotator cuff tendinopathy. Likely has some underlying OA.  We did discuss referral to PT or sports medicine however he deferred.  We discussed home exercises.  He can use over-the-counter meds as needed.  He would like to check x-ray.  Given that this has been going on for about a year this is reasonable.  Will likely need to follow-up with sports medicine or PT depending on results.  We discussed reasons to return to care.  Chronic Problems Addressed Today: DM type 2 with diabetic dyslipidemia ([place]) He only recently was able to start Ozempic due to stock issues with pharmacy.  He is back on 0.5 mg with this for last 2 weeks.  Is also been compliant with his metformin 2000 mg total daily.  Recheck A1c today.  Will probably come back in 3 months to recheck A1c as it would likely not be at goal due to just restarting his Ozempic.  Hypertension associated with diabetes ([place]) Blood pressure at goal today on amlodipine-olmesartan 10-40.  Dyslipidemia associated with type 2 diabetes mellitus ([place]) Doing well on Crestor 10 mg daily.  Check lipids.  Preventative Healthcare: Vaccines declined. UTD on colon cancer screening. Check labs.   Patient Counseling(The following topics were reviewed and/or handout was given):  -Nutrition: Stressed importance of moderation in sodium/caffeine intake, saturated fat and cholesterol, caloric balance, sufficient intake of fresh fruits, vegetables, and fiber.  -Stressed the importance of regular exercise.   -Substance Abuse: Discussed cessation/primary prevention of tobacco, alcohol, or other drug use; driving or other dangerous activities under the influence; availability of treatment for abuse.   -Injury prevention: Discussed safety belts, safety helmets, smoke detector, smoking near  bedding or upholstery.   -Sexuality: Discussed sexually transmitted diseases, partner selection, use of condoms, avoidance of unintended pregnancy and contraceptive alternatives.   -Dental health: Discussed importance of regular tooth brushing, flossing, and dental visits.  -Health maintenance and immunizations reviewed. Please refer to Health maintenance section.  Return to care in 1 year for next preventative visit.     Subjective:  HPI:  See A/P for status of chronic conditions.  He is still having some issues with left shoulder pain. This has been going on for about a year or so. Tried voltaren and tylenol with modest improvement. Worse with certain motions such as reaching overhead and reaching behind him.  No specific injuries or precipitating events.  Lifestyle Diet: Trying to get more fruits and vegetables.  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 Plays golf.      03/28/2022    7:57 AM  Depression screen PHQ 2/9  Decreased Interest 0  Down, Depressed, Hopeless 0  PHQ - 2 Score 0    Health Maintenance Due  Topic Date Due   Medicare Annual Wellness (AWV)  Never done   FOOT EXAM  Never done   OPHTHALMOLOGY EXAM  01/11/2022   Diabetic kidney evaluation - eGFR measurement  03/24/2022     ROS: Per HPI, otherwise a complete review of systems was negative.   PMH:  The following were reviewed and entered/updated in epic: Past Medical History:  Diagnosis Date   Hypertension    Patient Active Problem List   Diagnosis Date Noted   Osteoarthritis 07/13/2019   OSA on CPAP 03/09/2019   DM type 2 with diabetic dyslipidemia ([place])  06/07/2016   Hypertension associated with diabetes ([place]) 11/15/2011   Dyslipidemia associated with type 2 diabetes mellitus ([place]) 11/15/2011   Past Surgical History:  Procedure Laterality Date   REPLACEMENT TOTAL KNEE Left     History reviewed. No pertinent family history.  Medications- reviewed and updated Current  Outpatient Medications  Medication Sig Dispense Refill   amLODipine-olmesartan (AZOR) 10-40 MG tablet Take 1 tablet by mouth daily. 90 tablet 3   aspirin 81 MG EC tablet      hydrocortisone 2.5 % cream Apply topically 2 (two) times daily. 30 g 0   metFORMIN (GLUCOPHAGE-XR) 500 MG 24 hr tablet TAKE 4 TABLETS(2000 MG) BY MOUTH DAILY 360 tablet 3   Multiple Vitamins-Minerals (MULTIVITAMIN MEN 50+) TABS      OZEMPIC, 0.25 OR 0.5 MG/DOSE, 2 MG/3ML SOPN INJECT 0.'25MG'$  INTO THE SKIN ONCE WEEKLY FOR 4 WEEKS THEN INCREASE TO 0.'5MG'$  WEEKLY 3 mL 1   rosuvastatin (CRESTOR) 10 MG tablet TAKE 1 TABLET(10 MG) BY MOUTH DAILY 90 tablet 3   No current facility-administered medications for this visit.    Allergies-reviewed and updated No Known Allergies  Social History   Socioeconomic History   Marital status: Single    Spouse name: Not on file   Number of children: Not on file   Years of education: Not on file   Highest education level: Not on file  Occupational History   Not on file  Tobacco Use   Smoking status: Former    Types: Cigarettes    Quit date: 07/13/1990    Years since quitting: 31.7   Smokeless tobacco: Never  Vaping Use   Vaping Use: Never used  Substance and Sexual Activity   Alcohol use: Yes   Drug use: Never   Sexual activity: Not on file  Other Topics Concern   Not on file  Social History Narrative   Not on file   Social Determinants of Health   Financial Resource Strain: Not on file  Food Insecurity: Not on file  Transportation Needs: Not on file  Physical Activity: Not on file  Stress: Not on file  Social Connections: Not on file        Objective:  Physical Exam: BP 122/78   Pulse 77   Temp 98 F (36.7 C) (Temporal)   Ht '5\' 9"'$  (1.753 m)   Wt 186 lb 3.2 oz (84.5 kg)   SpO2 98%   BMI 27.50 kg/m   Body mass index is 27.5 kg/m. Wt Readings from Last 3 Encounters:  03/28/22 186 lb 3.2 oz (84.5 kg)  10/09/21 185 lb 12.8 oz (84.3 kg)  07/04/21 187 lb 6.4  oz (85 kg)   Gen: NAD, resting comfortably HEENT: TMs normal bilaterally. OP clear. No thyromegaly noted.  CV: RRR with no murmurs appreciated Pulm: NWOB, CTAB with no crackles, wheezes, or rhonchi GI: Normal bowel sounds present. Soft, Nontender, Nondistended. MSK:  - Left Shoulder: No deformities.  No pain with resisted supraspinatus testing.  Pain elicited with resisted external rotation.  Neurovascular intact distally. Skin: warm, dry Neuro: CN2-12 grossly intact. Strength 5/5 in upper and lower extremities. Reflexes symmetric and intact bilaterally.  Psych: Normal affect and thought content     Chen Holzman M. Jerline Pain, MD 03/28/2022 8:49 AM

## 2022-03-28 NOTE — Assessment & Plan Note (Signed)
Blood pressure at goal today on amlodipine-olmesartan 10-40.

## 2022-03-28 NOTE — Assessment & Plan Note (Signed)
Doing well on Crestor 10 mg daily.  Check lipids.

## 2022-03-30 NOTE — Progress Notes (Signed)
Please inform patient of the following:  X-ray shows he has mild underlying osteoarthritis.  He should let us know if his pain is not improving and we can refer him to see sports medicine.

## 2022-03-30 NOTE — Progress Notes (Signed)
Please inform patient of the following:  His A1c is not at goal.  We need to ramp up on his Ozempic.  He should stay at the starting dose for 4 weeks total and then go to 0.5 mg weekly.  Would like for him to check in with me in a few weeks and we can adjust the dose as needed.  We need to have him come back in 3 months to recheck his A1c.  His triglycerides are also significantly elevated but this is also due to his blood sugar and should improve with the Ozempic.  His cholesterol levels otherwise are at goal.  We can recheck everything else in a year.

## 2022-04-02 ENCOUNTER — Other Ambulatory Visit: Payer: Self-pay

## 2022-04-02 MED ORDER — OZEMPIC (0.25 OR 0.5 MG/DOSE) 2 MG/3ML ~~LOC~~ SOPN: 0.5000 mg | PEN_INJECTOR | SUBCUTANEOUS | 1 refills | Status: DC

## 2022-05-22 DIAGNOSIS — H53149 Visual discomfort, unspecified: Secondary | ICD-10-CM | POA: Diagnosis not present

## 2022-05-22 LAB — HM DIABETES EYE EXAM

## 2022-06-09 ENCOUNTER — Other Ambulatory Visit: Payer: Self-pay | Admitting: Family Medicine

## 2022-07-11 ENCOUNTER — Encounter: Payer: Self-pay | Admitting: Family Medicine

## 2022-07-11 ENCOUNTER — Ambulatory Visit (INDEPENDENT_AMBULATORY_CARE_PROVIDER_SITE_OTHER): Payer: Medicare Other | Admitting: Family Medicine

## 2022-07-11 VITALS — BP 132/84 | HR 69 | Temp 97.3°F | Ht 69.0 in | Wt 183.8 lb

## 2022-07-11 DIAGNOSIS — E785 Hyperlipidemia, unspecified: Secondary | ICD-10-CM

## 2022-07-11 DIAGNOSIS — Z7985 Long-term (current) use of injectable non-insulin antidiabetic drugs: Secondary | ICD-10-CM

## 2022-07-11 DIAGNOSIS — I152 Hypertension secondary to endocrine disorders: Secondary | ICD-10-CM | POA: Diagnosis not present

## 2022-07-11 DIAGNOSIS — E1169 Type 2 diabetes mellitus with other specified complication: Secondary | ICD-10-CM

## 2022-07-11 DIAGNOSIS — E1159 Type 2 diabetes mellitus with other circulatory complications: Secondary | ICD-10-CM | POA: Diagnosis not present

## 2022-07-11 DIAGNOSIS — K219 Gastro-esophageal reflux disease without esophagitis: Secondary | ICD-10-CM | POA: Insufficient documentation

## 2022-07-11 DIAGNOSIS — M1990 Unspecified osteoarthritis, unspecified site: Secondary | ICD-10-CM | POA: Diagnosis not present

## 2022-07-11 LAB — POCT GLYCOSYLATED HEMOGLOBIN (HGB A1C): Hemoglobin A1C: 8.5 % — AB (ref 4.0–5.6)

## 2022-07-11 MED ORDER — RYBELSUS 7 MG PO TABS: 7.0000 mg | ORAL_TABLET | Freq: Every day | ORAL | 3 refills | Status: DC

## 2022-07-11 MED ORDER — BACLOFEN 10 MG PO TABS: 5.0000 mg | ORAL_TABLET | Freq: Three times a day (TID) | ORAL | 0 refills | Status: DC

## 2022-07-11 MED ORDER — FAMOTIDINE 20 MG PO TABS: 20.0000 mg | ORAL_TABLET | Freq: Two times a day (BID) | ORAL | 3 refills | Status: AC

## 2022-07-11 MED ORDER — MELOXICAM 15 MG PO TABS: 15.0000 mg | ORAL_TABLET | Freq: Every day | ORAL | 0 refills | Status: DC

## 2022-07-11 NOTE — Assessment & Plan Note (Signed)
Continue management per orthopedics.  Should have some improvement with meloxicam we are starting for his low back pain.

## 2022-07-11 NOTE — Assessment & Plan Note (Signed)
A1c improved to 8.5.  We did discuss increasing dose of Ozempic however he would like to defer for now.  He has also coming up on diet all and is worried about cost of medications.  He would like to switch back to Rybelsus if possible.  We will switch to Rybelsus 7 mg daily.  Continue metformin 2000 mg daily.  He will continue to work on diet and exercise and we will recheck A1c in 3 months.

## 2022-07-11 NOTE — Assessment & Plan Note (Signed)
At goal on amlodipine-olmesartan 10-40 once daily.

## 2022-07-11 NOTE — Patient Instructions (Addendum)
It was very nice to see you today!  We will switch your Ozempic to rybelsus.  Please try the famotidine to help with your reflux.  Take the meloxicam and baclofen for your back pain.  Work on [employer]all[employer]. Let us know if not improving.  Return in about 3 months (around 10/11/2022), or if symptoms worsen or fail to improve.   Take care, Dr [name]  PLEASE NOTE:  If you had any lab tests, please let us know if you have not heard back within a few days. You may see your results on mychart before we have a chance to review them but we will give you a call once they are reviewed by [place].   If we ordered any referrals today, please let us know if you have not heard from their office within the next week.   If you had any urgent prescriptions sent in today, please check with the pharmacy within an hour of our visit to make sure the prescription was transmitted appropriately.   Please try these tips to maintain a healthy lifestyle:  Eat at least 3 REAL meals and 1-2 snacks per day.  Aim for no more than 5 hours between eating.  If you eat breakfast, please do so within one hour of getting up.   Each meal should contain half fruits/vegetables, one quarter protein, and one quarter carbs (no bigger than a computer mouse)  Cut down on sweet beverages. This includes juice, soda, and sweet tea.   Drink at least 1 glass of water with each meal and aim for at least 8 glasses per day  Exercise at least 150 minutes every week.

## 2022-07-11 NOTE — Progress Notes (Signed)
   Matthew Harris is a 67 y.o. male who presents today for an office visit.  Assessment/Plan:  New/Acute Problems: Low Back Pain No red flags.  Consistent with muscular strain.  We discussed home exercise program and handout was given.  Also start meloxicam and baclofen.  We discussed potential side effects.  He will avoid any other NSAIDs while on these medications.  He will also continue using heating pad.  He will let us know if not improving in the next 1 to 2 weeks and would consider referral to PT or sports medicine at that time.  Chronic Problems Addressed Today: DM type 2 with diabetic dyslipidemia (HCC) A1c improved to 8.5.  We did discuss increasing dose of Ozempic however he would like to defer for now.  He has also coming up on diet all and is worried about cost of medications.  He would like to switch back to Rybelsus if possible.  We will switch to Rybelsus 7 mg daily.  Continue metformin 2000 mg daily.  He will continue to work on diet and exercise and we will recheck A1c in 3 months.   Hypertension associated with diabetes (HCC) At goal on amlodipine-olmesartan 10-40 once daily.   Osteoarthritis Continue management per orthopedics.  Should have some improvement with meloxicam we are starting for his low back pain.      Subjective:  HPI:  See A/P for status of chronic conditions.  Patient is here today for follow-up.  Seen about 3 months ago for physical.  A1c at that time was 11.4.  We started him on Ozempic  He has also had some issues with low back pain. This started several days ago. He did recently sleep on an air mattress and and played golf which he thinks may have caused this. Tried taking aleve and tylenol with modest improvement. Pain does not radiate. No weakness of numbness. No tingling. He has had similar issues intermittently the last few years.   He has also been having some intermittent reflux issues.  This is going on for quite a while.  Over-the-counter  famotidine and Tums do help.       Objective:  Physical Exam: BP 132/84   Pulse 69   Temp (!) 97.3 F (36.3 C) (Temporal)   Ht 5\' 9"  (1.753 m)   Wt 183 lb 12.8 oz (83.4 kg)   SpO2 98%   BMI 27.14 kg/m   Gen: No acute distress, resting comfortably CV: Regular rate and rhythm with no murmurs appreciated Pulm: Normal work of breathing, clear to auscultation bilaterally with no crackles, wheezes, or rhonchi MUSCULOSKELETAL: Back without deformities.  Tenderness palpation along lower lumbar paraspinal muscles.  Neurovascular intact distally. Neuro: Grossly normal, moves all extremities Psych: Normal affect and thought content      Melisse Caetano M. [name], MD 07/11/2022 8:44 AM

## 2022-09-07 ENCOUNTER — Other Ambulatory Visit: Payer: Self-pay | Admitting: Family Medicine

## 2022-11-05 ENCOUNTER — Encounter: Payer: Self-pay | Admitting: Family Medicine

## 2022-11-05 ENCOUNTER — Ambulatory Visit (INDEPENDENT_AMBULATORY_CARE_PROVIDER_SITE_OTHER): Payer: Medicare Other | Admitting: Family Medicine

## 2022-11-05 VITALS — BP 118/72 | HR 72 | Temp 97.8°F | Ht 69.0 in | Wt 183.6 lb

## 2022-11-05 DIAGNOSIS — I152 Hypertension secondary to endocrine disorders: Secondary | ICD-10-CM

## 2022-11-05 DIAGNOSIS — E785 Hyperlipidemia, unspecified: Secondary | ICD-10-CM

## 2022-11-05 DIAGNOSIS — E1159 Type 2 diabetes mellitus with other circulatory complications: Secondary | ICD-10-CM | POA: Diagnosis not present

## 2022-11-05 DIAGNOSIS — E1169 Type 2 diabetes mellitus with other specified complication: Secondary | ICD-10-CM

## 2022-11-05 DIAGNOSIS — Z7985 Long-term (current) use of injectable non-insulin antidiabetic drugs: Secondary | ICD-10-CM | POA: Diagnosis not present

## 2022-11-05 DIAGNOSIS — Z7984 Long term (current) use of oral hypoglycemic drugs: Secondary | ICD-10-CM | POA: Diagnosis not present

## 2022-11-05 DIAGNOSIS — Z23 Encounter for immunization: Secondary | ICD-10-CM

## 2022-11-05 LAB — POCT GLYCOSYLATED HEMOGLOBIN (HGB A1C): Hemoglobin A1C: 11.1 % — AB (ref 4.0–5.6)

## 2022-11-05 MED ORDER — TIRZEPATIDE 5 MG/0.5ML ~~LOC~~ SOAJ: 5.0000 mg | SUBCUTANEOUS | 0 refills | Status: DC

## 2022-11-05 NOTE — Assessment & Plan Note (Signed)
Blood pressure at goal today on amlodipine-olmesartan 10-40 once daily.

## 2022-11-05 NOTE — Patient Instructions (Addendum)
It was very nice to see you today!  Your Hemoglobin A1c is 11.1. We need to work on getting this lower.  Please try the [facility]. Let me know in a few weeks how this is going.  Return in about 3 months (around 02/05/2023) for Follow Up.   Take care, Dr [name]  PLEASE NOTE:  If you had any lab tests, please let us know if you have not heard back within a few days. You may see your results on mychart before we have a chance to review them but we will give you a call once they are reviewed by [place].   If we ordered any referrals today, please let us know if you have not heard from their office within the next week.   If you had any urgent prescriptions sent in today, please check with the pharmacy within an hour of our visit to make sure the prescription was transmitted appropriately.   Please try these tips to maintain a healthy lifestyle:  Eat at least 3 REAL meals and 1-2 snacks per day.  Aim for no more than 5 hours between eating.  If you eat breakfast, please do so within one hour of getting up.   Each meal should contain half fruits/vegetables, one quarter protein, and one quarter carbs (no bigger than a computer mouse)  Cut down on sweet beverages. This includes juice, soda, and sweet tea.   Drink at least 1 glass of water with each meal and aim for at least 8 glasses per day  Exercise at least 150 minutes every week.

## 2022-11-05 NOTE — Assessment & Plan Note (Addendum)
A1c 11.1.  We discussed need to get his sugar lower.  He has been working on lifestyle modifications.  He has been compliant with medication regimen.  We did briefly discuss insulin however he would like to hold off on this for now.  We will try Mounjaro 5 mg weekly.  He can stop the Rybelsus.  Continue metformin 2000 mg daily.  He is having issues with being in the donut hole and affordability of his medications.  He will let us know if he is not able to afford the [facility] and we may try alternative medication such as Jardiance or increased dose of Rybelsus. he will continue to work on diet and exercise.

## 2022-11-05 NOTE — Progress Notes (Signed)
   Matthew Harris is a 67 y.o. male who presents today for an office visit.  Assessment/Plan:  Chronic Problems Addressed Today: DM type 2 with diabetic dyslipidemia (HCC) A1c 11.1.  We discussed need to get his sugar lower.  He has been working on lifestyle modifications.  He has been compliant with medication regimen.  We did briefly discuss insulin however he would like to hold off on this for now.  We will try Mounjaro 5 mg weekly.  He can stop the Rybelsus.  Continue metformin 2000 mg daily.  He is having issues with being in the donut hole and affordability of his medications.  He will let us know if he is not able to afford the [facility] and we may try alternative medication such as Jardiance or increased dose of Rybelsus. he will continue to work on diet and exercise.   Hypertension associated with diabetes (HCC) Blood pressure at goal today on amlodipine-olmesartan 10-40 once daily.  Flu shot given today.     Subjective:  HPI:  See Assessment / plan for status of chronic conditions. Patient here today for follow up. Patient last seen here 3 and half months ago.  At that time A1c was 8.5.  We switched him from Ozempic back to the Rybelsus 7 mg daily for his requesting continued metformin 2000 mg daily.  He was also having issues with low back pain.  We started him on meloxicam and baclofen.  Not checking sugar at home. He is trying to cut down on carbohydrates.        Objective:  Physical Exam: BP 118/72   Pulse 72   Temp 97.8 F (36.6 C)   Ht 5\' 9"  (1.753 m)   Wt 183 lb 9.6 oz (83.3 kg)   SpO2 97%   BMI 27.11 kg/m   Gen: No acute distress, resting comfortably Neuro: Grossly normal, moves all extremities Psych: Normal affect and thought content      Matthew Harris M. [name], MD 11/05/2022 8:18 AM

## 2022-12-06 ENCOUNTER — Other Ambulatory Visit: Payer: Self-pay | Admitting: Family Medicine

## 2023-02-12 ENCOUNTER — Ambulatory Visit (INDEPENDENT_AMBULATORY_CARE_PROVIDER_SITE_OTHER): Payer: Medicare Other | Admitting: Family Medicine

## 2023-02-12 ENCOUNTER — Encounter: Payer: Self-pay | Admitting: Family Medicine

## 2023-02-12 VITALS — BP 133/82 | HR 70 | Temp 97.3°F | Ht 69.0 in | Wt 184.2 lb

## 2023-02-12 DIAGNOSIS — I152 Hypertension secondary to endocrine disorders: Secondary | ICD-10-CM | POA: Diagnosis not present

## 2023-02-12 DIAGNOSIS — E1169 Type 2 diabetes mellitus with other specified complication: Secondary | ICD-10-CM | POA: Diagnosis not present

## 2023-02-12 DIAGNOSIS — E1159 Type 2 diabetes mellitus with other circulatory complications: Secondary | ICD-10-CM

## 2023-02-12 DIAGNOSIS — E785 Hyperlipidemia, unspecified: Secondary | ICD-10-CM | POA: Diagnosis not present

## 2023-02-12 DIAGNOSIS — Z7984 Long term (current) use of oral hypoglycemic drugs: Secondary | ICD-10-CM

## 2023-02-12 DIAGNOSIS — Z7985 Long-term (current) use of injectable non-insulin antidiabetic drugs: Secondary | ICD-10-CM | POA: Diagnosis not present

## 2023-02-12 LAB — POCT GLYCOSYLATED HEMOGLOBIN (HGB A1C): Hemoglobin A1C: 8.7 % — AB (ref 4.0–5.6)

## 2023-02-12 LAB — MICROALBUMIN / CREATININE URINE RATIO
Creatinine,U: 29.5 mg/dL
Microalb Creat Ratio: 92.4 mg/g — ABNORMAL HIGH (ref 0.0–30.0)
Microalb, Ur: 27.3 mg/dL — ABNORMAL HIGH (ref 0.0–1.9)

## 2023-02-12 MED ORDER — TIRZEPATIDE 7.5 MG/0.5ML ~~LOC~~ SOAJ: 7.5000 mg | SUBCUTANEOUS | 1 refills | Status: DC

## 2023-02-12 NOTE — Assessment & Plan Note (Signed)
 A1c improved to 8.7.  Congratulated patient on A1c reduction.  He has done well with Mounjaro .  He did have mild headache but this is manageable.  We will increase dose to 7.5 mg weekly.  He will continue metformin  2000 mg daily.  He is doing a great job with diet and exercise and will continue this.  Recheck A1c in 3 months.  He will let us  know if he has any worsening headache or other side effects with increasing dose of Mounjaro .  Patient would like to wean down on metformin  at some point in the future if able to.

## 2023-02-12 NOTE — Progress Notes (Signed)
   Matthew Harris is a 68 y.o. male who presents today for an office visit.  Assessment/Plan:  Chronic Problems Addressed Today: DM type 2 with diabetic dyslipidemia (HCC) A1c improved to 8.7.  Congratulated patient on A1c reduction.  He has done well with Mounjaro .  He did have mild headache but this is manageable.  We will increase dose to 7.5 mg weekly.  He will continue metformin  2000 mg daily.  He is doing a great job with diet and exercise and will continue this.  Recheck A1c in 3 months.  He will let us  know if he has any worsening headache or other side effects with increasing dose of Mounjaro .  Patient would like to wean down on metformin  at some point in the future if able to.  Hypertension associated with diabetes (HCC) Blood pressure at goal on amlodipine -olmesartan  10-40 once daily.     Subjective:  HPI:  See Assessment / plan for status of chronic conditions. Patient here today for 3 month follow. We saw him 3 months ago. At that time his A1c was 11.1 We changed his Rybelsus  to Mounjaro  5 mg weekly. HE continued metformin  2000 mg daily.  He is doing well today. He has done well with Mounjaro . He does get an occasional headache but this is manageable.        Objective:  Physical Exam: BP 133/82   Pulse 70   Temp (!) 97.3 F (36.3 C) (Temporal)   Ht 5' 9 (1.753 m)   Wt 184 lb 3.2 oz (83.6 kg)   SpO2 (!) 79%   BMI 27.20 kg/m   Gen: No acute distress, resting comfortably Neuro: Grossly normal, moves all extremities Psych: Normal affect and thought content      Matthew Harris M. Kennyth, MD 02/12/2023 7:57 AM

## 2023-02-12 NOTE — Assessment & Plan Note (Signed)
 Blood pressure at goal on amlodipine olmesartan 10-40 once daily

## 2023-02-12 NOTE — Addendum Note (Signed)
 Addended by: Dyann Kief on: 02/12/2023 08:07 AM   Modules accepted: Orders

## 2023-02-12 NOTE — Progress Notes (Signed)
 He has protein in his urine.  This is due to the diabetes.  Hopefully this will improve now that his sugar is better controlled.  We can recheck this again in a year.

## 2023-02-12 NOTE — Patient Instructions (Signed)
 It was very nice to see you today!  Your A1c today is 8.7. We will increase your Mounjaro  to 7.5 mg weekly.   Please keep up the good work with diet and exercise.   Return in about 3 months (around 05/13/2023) for Follow Up.   Take care, Dr Kennyth  PLEASE NOTE:  If you had any lab tests, please let us  know if you have not heard back within a few days. You may see your results on mychart before we have a chance to review them but we will give you a call once they are reviewed by us .   If we ordered any referrals today, please let us  know if you have not heard from their office within the next week.   If you had any urgent prescriptions sent in today, please check with the pharmacy within an hour of our visit to make sure the prescription was transmitted appropriately.   Please try these tips to maintain a healthy lifestyle:  Eat at least 3 REAL meals and 1-2 snacks per day.  Aim for no more than 5 hours between eating.  If you eat breakfast, please do so within one hour of getting up.   Each meal should contain half fruits/vegetables, one quarter protein, and one quarter carbs (no bigger than a computer mouse)  Cut down on sweet beverages. This includes juice, soda, and sweet tea.   Drink at least 1 glass of water with each meal and aim for at least 8 glasses per day  Exercise at least 150 minutes every week.

## 2023-04-02 ENCOUNTER — Encounter: Payer: Self-pay | Admitting: Family Medicine

## 2023-04-02 ENCOUNTER — Ambulatory Visit: Payer: Medicare Other | Admitting: Family Medicine

## 2023-04-02 VITALS — BP 122/80 | HR 80 | Temp 97.3°F | Ht 69.0 in | Wt 181.2 lb

## 2023-04-02 DIAGNOSIS — E1169 Type 2 diabetes mellitus with other specified complication: Secondary | ICD-10-CM

## 2023-04-02 DIAGNOSIS — E1159 Type 2 diabetes mellitus with other circulatory complications: Secondary | ICD-10-CM | POA: Diagnosis not present

## 2023-04-02 DIAGNOSIS — Z0001 Encounter for general adult medical examination with abnormal findings: Secondary | ICD-10-CM | POA: Diagnosis not present

## 2023-04-02 DIAGNOSIS — E785 Hyperlipidemia, unspecified: Secondary | ICD-10-CM | POA: Diagnosis not present

## 2023-04-02 DIAGNOSIS — Z7985 Long-term (current) use of injectable non-insulin antidiabetic drugs: Secondary | ICD-10-CM | POA: Diagnosis not present

## 2023-04-02 DIAGNOSIS — Z7984 Long term (current) use of oral hypoglycemic drugs: Secondary | ICD-10-CM | POA: Diagnosis not present

## 2023-04-02 DIAGNOSIS — M1990 Unspecified osteoarthritis, unspecified site: Secondary | ICD-10-CM

## 2023-04-02 DIAGNOSIS — Z125 Encounter for screening for malignant neoplasm of prostate: Secondary | ICD-10-CM | POA: Diagnosis not present

## 2023-04-02 DIAGNOSIS — I152 Hypertension secondary to endocrine disorders: Secondary | ICD-10-CM

## 2023-04-02 DIAGNOSIS — Z1159 Encounter for screening for other viral diseases: Secondary | ICD-10-CM | POA: Diagnosis not present

## 2023-04-02 LAB — COMPREHENSIVE METABOLIC PANEL
ALT: 35 U/L (ref 0–53)
AST: 26 U/L (ref 0–37)
Albumin: 4.8 g/dL (ref 3.5–5.2)
Alkaline Phosphatase: 53 U/L (ref 39–117)
BUN: 12 mg/dL (ref 6–23)
CO2: 26 meq/L (ref 19–32)
Calcium: 9.9 mg/dL (ref 8.4–10.5)
Chloride: 99 meq/L (ref 96–112)
Creatinine, Ser: 1.03 mg/dL (ref 0.40–1.50)
GFR: 75.91 mL/min (ref >60.00)
Glucose, Bld: 160 mg/dL — ABNORMAL HIGH (ref 70–99)
Potassium: 4.7 meq/L (ref 3.5–5.1)
Sodium: 137 meq/L (ref 135–145)
Total Bilirubin: 0.6 mg/dL (ref 0.2–1.2)
Total Protein: 7.5 g/dL (ref 6.0–8.3)

## 2023-04-02 LAB — CBC
HCT: 48.4 % (ref 39.0–52.0)
Hemoglobin: 16.2 g/dL (ref 13.0–17.0)
MCHC: 33.5 g/dL (ref 30.0–36.0)
MCV: 94.3 fl (ref 78.0–100.0)
Platelets: 217 10*3/uL (ref 150.0–400.0)
RBC: 5.14 Mil/uL (ref 4.22–5.81)
RDW: 13.1 % (ref 11.5–15.5)
WBC: 6.4 10*3/uL (ref 4.0–10.5)

## 2023-04-02 LAB — MICROALBUMIN / CREATININE URINE RATIO
Creatinine,U: 54.9 mg/dL
Microalb Creat Ratio: 717.6 mg/g — ABNORMAL HIGH (ref 0.0–30.0)
Microalb, Ur: 39.4 mg/dL — ABNORMAL HIGH (ref 0.0–1.9)

## 2023-04-02 LAB — LIPID PANEL
Cholesterol: 112 mg/dL (ref 0–200)
HDL: 37.9 mg/dL — ABNORMAL LOW (ref >39.00)
LDL Cholesterol: 6 mg/dL (ref 0–99)
NonHDL: 73.9
Total CHOL/HDL Ratio: 3
Triglycerides: 338 mg/dL — ABNORMAL HIGH (ref 0.0–149.0)
VLDL: 67.6 mg/dL — ABNORMAL HIGH (ref 0.0–40.0)

## 2023-04-02 LAB — PSA: PSA: 0.9 ng/mL (ref 0.10–4.00)

## 2023-04-02 LAB — TSH: TSH: 4.83 u[IU]/mL (ref 0.35–5.50)

## 2023-04-02 MED ORDER — MELOXICAM 15 MG PO TABS: 15.0000 mg | ORAL_TABLET | Freq: Every day | ORAL | 0 refills | Status: DC

## 2023-04-02 NOTE — Assessment & Plan Note (Signed)
 Mild flare recently in his right knee.  Follows with orthopedics for this.  Will refill his meloxicam.  Also discussed ice and compression.

## 2023-04-02 NOTE — Progress Notes (Signed)
 Chief Complaint:  Matthew Harris is a 68 y.o. male who presents today for his annual comprehensive physical exam.    Assessment/Plan:  Chronic Problems Addressed Today: DM type 2 with diabetic dyslipidemia (HCC) Too early to recheck A1c.  Last A1c was 8.7.  We increase his Mounjaro to 7.5 mg weekly.  Having occasional headache with this but otherwise tolerating well.  He is also having some diarrhea.  He is on metformin 2000 mg daily.  We will recheck his A1c in 6 weeks.  If A1c is at goal we will decrease dose of metformin.  Hypertension associated with diabetes (HCC) Blood pressure at goal on amlodipine olmesartan 10-40 once daily  Dyslipidemia associated with type 2 diabetes mellitus (HCC) Stable on Crestor 10 mg daily.  Check lipids.  Osteoarthritis Mild flare recently in his right knee.  Follows with orthopedics for this.  Will refill his meloxicam.  Also discussed ice and compression.  Preventative Healthcare: Check labs.  He was agreeable to Prevnar 20 however we are out of stock today.  We can address at his next visit.  Up-to-date on other vaccines.  Up-to-date on colon cancer screening.  Patient Counseling(The following topics were reviewed and/or handout was given):  -Nutrition: Stressed importance of moderation in sodium/caffeine intake, saturated fat and cholesterol, caloric balance, sufficient intake of fresh fruits, vegetables, and fiber.  -Stressed the importance of regular exercise.   -Substance Abuse: Discussed cessation/primary prevention of tobacco, alcohol, or other drug use; driving or other dangerous activities under the influence; availability of treatment for abuse.   -Injury prevention: Discussed safety belts, safety helmets, smoke detector, smoking near bedding or upholstery.   -Sexuality: Discussed sexually transmitted diseases, partner selection, use of condoms, avoidance of unintended pregnancy and contraceptive alternatives.   -Dental health: Discussed  importance of regular tooth brushing, flossing, and dental visits.  -Health maintenance and immunizations reviewed. Please refer to Health maintenance section.  Return to care in 1 year for next preventative visit.     Subjective:  HPI:  He has no acute complaints today.  See assessment / plan for status of chronic conditions.   Lifestyle Diet: Trying to get more salads, fruits, and vegetables.  Exercise: Working on treadmill 2-3 times per week. Some resistance training as well.      04/02/2023    7:43 AM  Depression screen PHQ 2/9  Decreased Interest 0  Down, Depressed, Hopeless 0  PHQ - 2 Score 0    Health Maintenance Due  Topic Date Due   Medicare Annual Wellness (AWV)  Never done   FOOT EXAM  Never done   Hepatitis C Screening  Never done   Diabetic kidney evaluation - eGFR measurement  03/29/2023     ROS: )Per HPI, otherwise a complete review of systems was negative.   PMH:  The following were reviewed and entered/updated in epic: Past Medical History:  Diagnosis Date   Hypertension    Patient Active Problem List   Diagnosis Date Noted   GERD (gastroesophageal reflux disease) 07/11/2022   Osteoarthritis 07/13/2019   OSA on CPAP 03/09/2019   DM type 2 with diabetic dyslipidemia (HCC) 06/07/2016   Hypertension associated with diabetes (HCC) 11/15/2011   Dyslipidemia associated with type 2 diabetes mellitus (HCC) 11/15/2011   Past Surgical History:  Procedure Laterality Date   REPLACEMENT TOTAL KNEE Left     History reviewed. No pertinent family history.  Medications- reviewed and updated Current Outpatient Medications  Medication Sig Dispense Refill   amLODipine-olmesartan (  AZOR) 10-40 MG tablet TAKE 1 TABLET BY MOUTH DAILY 90 tablet 3   aspirin 81 MG EC tablet      baclofen (LIORESAL) 10 MG tablet Take 0.5 tablets (5 mg total) by mouth 3 (three) times daily. 30 each 0   famotidine (PEPCID) 20 MG tablet Take 1 tablet (20 mg total) by mouth 2 (two) times  daily. 180 tablet 3   hydrocortisone 2.5 % cream Apply topically 2 (two) times daily. 30 g 0   metFORMIN (GLUCOPHAGE-XR) 500 MG 24 hr tablet TAKE 4 TABLETS(2000 MG) BY MOUTH DAILY 360 tablet 3   Multiple Vitamins-Minerals (MULTIVITAMIN MEN 50+) TABS      rosuvastatin (CRESTOR) 10 MG tablet TAKE 1 TABLET(10 MG) BY MOUTH DAILY 90 tablet 3   tirzepatide (MOUNJARO) 7.5 MG/0.5ML Pen Inject 7.5 mg into the skin once a week. 6 mL 1   meloxicam (MOBIC) 15 MG tablet Take 1 tablet (15 mg total) by mouth daily. 30 tablet 0   No current facility-administered medications for this visit.    Allergies-reviewed and updated No Known Allergies  Social History   Socioeconomic History   Marital status: Single    Spouse name: Not on file   Number of children: Not on file   Years of education: Not on file   Highest education level: Not on file  Occupational History   Not on file  Tobacco Use   Smoking status: Former    Current packs/day: 0.00    Types: Cigarettes    Quit date: 07/13/1990    Years since quitting: 32.7   Smokeless tobacco: Never  Vaping Use   Vaping status: Never Used  Substance and Sexual Activity   Alcohol use: Yes   Drug use: Never   Sexual activity: Not on file  Other Topics Concern   Not on file  Social History Narrative   Not on file   Social Drivers of Health   Financial Resource Strain: Patient Declined (07/07/2022)   Overall Financial Resource Strain (CARDIA)    Difficulty of Paying Living Expenses: Patient declined  Food Insecurity: Patient Declined (07/07/2022)   Hunger Vital Sign    Worried About Running Out of Food in the Last Year: Patient declined    Ran Out of Food in the Last Year: Patient declined  Transportation Needs: Not on file  Physical Activity: Insufficiently Active (07/07/2022)   Exercise Vital Sign    Days of Exercise per Week: 3 days    Minutes of Exercise per Session: 30 min  Stress: Patient Declined (07/07/2022)   [employer] of  Occupational Health - Occupational Stress Questionnaire    Feeling of Stress : Patient declined  Social Connections: Unknown (07/07/2022)   Social Connection and Isolation Panel [NHANES]    Frequency of Communication with Friends and Family: Patient declined    Frequency of Social Gatherings with Friends and Family: Patient declined    Attends Religious Services: Patient declined    Database administrator or Organizations: No    Attends Engineer, structural: Not on file    Marital Status: Patient declined        Objective:  Physical Exam: BP 122/80   Pulse 80   Temp (!) 97.3 F (36.3 C) (Temporal)   Ht 5\' 9"  (1.753 m)   Wt 181 lb 3.2 oz (82.2 kg)   SpO2 98%   BMI 26.76 kg/m   Body mass index is 26.76 kg/m. Wt Readings from Last 3 Encounters:  04/02/23 181 lb  3.2 oz (82.2 kg)  02/12/23 184 lb 3.2 oz (83.6 kg)  11/05/22 183 lb 9.6 oz (83.3 kg)   Gen: NAD, resting comfortably HEENT: TMs normal bilaterally. OP clear. No thyromegaly noted.  CV: RRR with no murmurs appreciated Pulm: NWOB, CTAB with no crackles, wheezes, or rhonchi GI: Normal bowel sounds present. Soft, Nontender, Nondistended. MSK: no edema, cyanosis, or clubbing noted Skin: warm, dry Neuro: CN2-12 grossly intact. Strength 5/5 in upper and lower extremities. Reflexes symmetric and intact bilaterally.  Psych: Normal affect and thought content     Kailand Seda M. [name], MD 04/02/2023 8:21 AM

## 2023-04-02 NOTE — Assessment & Plan Note (Signed)
 Too early to recheck A1c.  Last A1c was 8.7.  We increase his Mounjaro to 7.5 mg weekly.  Having occasional headache with this but otherwise tolerating well.  He is also having some diarrhea.  He is on metformin 2000 mg daily.  We will recheck his A1c in 6 weeks.  If A1c is at goal we will decrease dose of metformin.

## 2023-04-02 NOTE — Patient Instructions (Addendum)
 It was very nice to see you today!  We will check blood work today.    Please try the meloxicam.  I will see you back next month to recheck your blood sugar.  Will see you back in a year for your next physical.  Return in about 1 year (around 04/01/2024) for Annual Physical.   Take care, Dr [name]  PLEASE NOTE:  If you had any lab tests, please let us know if you have not heard back within a few days. You may see your results on mychart before we have a chance to review them but we will give you a call once they are reviewed by [place].   If we ordered any referrals today, please let us know if you have not heard from their office within the next week.   If you had any urgent prescriptions sent in today, please check with the pharmacy within an hour of our visit to make sure the prescription was transmitted appropriately.   Please try these tips to maintain a healthy lifestyle:  Eat at least 3 REAL meals and 1-2 snacks per day.  Aim for no more than 5 hours between eating.  If you eat breakfast, please do so within one hour of getting up.   Each meal should contain half fruits/vegetables, one quarter protein, and one quarter carbs (no bigger than a computer mouse)  Cut down on sweet beverages. This includes juice, soda, and sweet tea.   Drink at least 1 glass of water with each meal and aim for at least 8 glasses per day  Exercise at least 150 minutes every week.     Preventive Care 13 Years and Older, Male Preventive care refers to lifestyle choices and visits with your health care provider that can promote health and wellness. Preventive care visits are also called wellness exams. What can I expect for my preventive care visit? Counseling During your preventive care visit, your health care provider may ask about your: Medical history, including: Past medical problems. Family medical history. History of falls. Current health, including: Emotional well-being. Home life and  relationship well-being. Sexual activity. Memory and ability to understand (cognition). Lifestyle, including: Alcohol, nicotine or tobacco, and drug use. Access to firearms. Diet, exercise, and sleep habits. Work and work Astronomer. Sunscreen use. Safety issues such as seatbelt and bike helmet use. Physical exam Your health care provider will check your: Height and weight. These may be used to calculate your BMI (body mass index). BMI is a measurement that tells if you are at a healthy weight. Waist circumference. This measures the distance around your waistline. This measurement also tells if you are at a healthy weight and may help predict your risk of certain diseases, such as type 2 diabetes and high blood pressure. Heart rate and blood pressure. Body temperature. Skin for abnormal spots. What immunizations do I need?  Vaccines are usually given at various ages, according to a schedule. Your health care provider will recommend vaccines for you based on your age, medical history, and lifestyle or other factors, such as travel or where you work. What tests do I need? Screening Your health care provider may recommend screening tests for certain conditions. This may include: Lipid and cholesterol levels. Diabetes screening. This is done by checking your blood sugar (glucose) after you have not eaten for a while (fasting). Hepatitis C test. Hepatitis B test. HIV (human immunodeficiency virus) test. STI (sexually transmitted infection) testing, if you are at risk. Lung cancer  screening. Colorectal cancer screening. Prostate cancer screening. Abdominal aortic aneurysm (AAA) screening. You may need this if you are a current or former smoker. Talk with your health care provider about your test results, treatment options, and if necessary, the need for more tests. Follow these instructions at home: Eating and drinking  Eat a diet that includes fresh fruits and vegetables, whole  grains, lean protein, and low-fat dairy products. Limit your intake of foods with high amounts of sugar, saturated fats, and salt. Take vitamin and mineral supplements as recommended by your health care provider. Do not drink alcohol if your health care provider tells you not to drink. If you drink alcohol: Limit how much you have to 0-2 drinks a day. Know how much alcohol is in your drink. In the [place] one drink equals one 12 oz bottle of beer (355 mL), one 5 oz glass of wine (148 mL), or one 1 oz glass of hard liquor (44 mL). Lifestyle Brush your teeth every morning and night with fluoride toothpaste. Floss one time each day. Exercise for at least 30 minutes 5 or more days each week. Do not use any products that contain nicotine or tobacco. These products include cigarettes, chewing tobacco, and vaping devices, such as e-cigarettes. If you need help quitting, ask your health care provider. Do not use drugs. If you are sexually active, practice safe sex. Use a condom or other form of protection to prevent STIs. Take aspirin only as told by your health care provider. Make sure that you understand how much to take and what form to take. Work with your health care provider to find out whether it is safe and beneficial for you to take aspirin daily. Ask your health care provider if you need to take a cholesterol-lowering medicine (statin). Find healthy ways to manage stress, such as: Meditation, yoga, or listening to music. Journaling. Talking to a trusted person. Spending time with friends and family. Safety Always wear your seat belt while driving or riding in a vehicle. Do not drive: If you have been drinking alcohol. Do not ride with someone who has been drinking. When you are tired or distracted. While texting. If you have been using any mind-altering substances or drugs. Wear a helmet and other protective equipment during sports activities. If you have firearms in your house, make sure  you follow all gun safety procedures. Minimize exposure to UV radiation to reduce your risk of skin cancer. What's next? Visit your health care provider once a year for an annual wellness visit. Ask your health care provider how often you should have your eyes and teeth checked. Stay up to date on all vaccines. This information is not intended to replace advice given to you by your health care provider. Make sure you discuss any questions you have with your health care provider. Document Revised: 07/13/2020 Document Reviewed: 07/13/2020 Elsevier Patient Education  2024 [employer].

## 2023-04-02 NOTE — Assessment & Plan Note (Signed)
 Stable on Crestor 10 mg daily.  Check lipids.

## 2023-04-02 NOTE — Assessment & Plan Note (Signed)
 Blood pressure at goal on amlodipine olmesartan 10-40 once daily

## 2023-04-03 LAB — HEPATITIS C ANTIBODY: Hepatitis C Ab: NONREACTIVE

## 2023-04-04 ENCOUNTER — Encounter: Payer: Self-pay | Admitting: Family Medicine

## 2023-04-04 DIAGNOSIS — R809 Proteinuria, unspecified: Secondary | ICD-10-CM | POA: Insufficient documentation

## 2023-04-04 NOTE — Progress Notes (Signed)
 He still has protein in his urine.  This is probably coming from the diabetes.  I hope this will improve as his A1c improves.  There is an additional medication that can also help with this.  We can discuss more at his next office visit next month.  The rest of his labs are all at goal.  Cholesterol levels are at goal.  We can recheck everything else in a year or so.

## 2023-05-01 ENCOUNTER — Encounter: Payer: Self-pay | Admitting: Family Medicine

## 2023-05-01 ENCOUNTER — Ambulatory Visit (INDEPENDENT_AMBULATORY_CARE_PROVIDER_SITE_OTHER): Admitting: Family Medicine

## 2023-05-01 VITALS — BP 136/81 | HR 80 | Temp 97.7°F | Ht 69.0 in | Wt 179.6 lb

## 2023-05-01 DIAGNOSIS — E1159 Type 2 diabetes mellitus with other circulatory complications: Secondary | ICD-10-CM

## 2023-05-01 DIAGNOSIS — E1169 Type 2 diabetes mellitus with other specified complication: Secondary | ICD-10-CM

## 2023-05-01 DIAGNOSIS — R809 Proteinuria, unspecified: Secondary | ICD-10-CM

## 2023-05-01 DIAGNOSIS — E785 Hyperlipidemia, unspecified: Secondary | ICD-10-CM | POA: Diagnosis not present

## 2023-05-01 DIAGNOSIS — I152 Hypertension secondary to endocrine disorders: Secondary | ICD-10-CM

## 2023-05-01 DIAGNOSIS — Z7984 Long term (current) use of oral hypoglycemic drugs: Secondary | ICD-10-CM | POA: Diagnosis not present

## 2023-05-01 LAB — POCT GLYCOSYLATED HEMOGLOBIN (HGB A1C): Hemoglobin A1C: 7.1 % — AB (ref 4.0–5.6)

## 2023-05-01 LAB — MICROALBUMIN / CREATININE URINE RATIO
Creatinine,U: 66.1 mg/dL
Microalb Creat Ratio: 560.1 mg/g — ABNORMAL HIGH (ref 0.0–30.0)
Microalb, Ur: 37 mg/dL — ABNORMAL HIGH (ref 0.0–1.9)

## 2023-05-01 NOTE — Patient Instructions (Addendum)
 It was very nice to see you today!  Your hemoglobin A1c is 7.1.   Keep up the great work with your diet and exercise.  No medication changes today.  Will see you back in 3 months to recheck your A1c.  We may decrease your dose of metformin if your sugar is still at goal.  Return in about 3 months (around 07/31/2023) for Follow Up.   Take care, Dr [name]  PLEASE NOTE:  If you had any lab tests, please let us know if you have not heard back within a few days. You may see your results on mychart before we have a chance to review them but we will give you a call once they are reviewed by [place].   If we ordered any referrals today, please let us know if you have not heard from their office within the next week.   If you had any urgent prescriptions sent in today, please check with the pharmacy within an hour of our visit to make sure the prescription was transmitted appropriately.   Please try these tips to maintain a healthy lifestyle:  Eat at least 3 REAL meals and 1-2 snacks per day.  Aim for no more than 5 hours between eating.  If you eat breakfast, please do so within one hour of getting up.   Each meal should contain half fruits/vegetables, one quarter protein, and one quarter carbs (no bigger than a computer mouse)  Cut down on sweet beverages. This includes juice, soda, and sweet tea.   Drink at least 1 glass of water with each meal and aim for at least 8 glasses per day  Exercise at least 150 minutes every week.

## 2023-05-01 NOTE — Progress Notes (Signed)
   Matthew Harris is a 68 y.o. male who presents today for an office visit.  Assessment/Plan:  Chronic Problems Addressed Today: DM type 2 with diabetic dyslipidemia (HCC) A1c much better controlled today at 7.1.  He is currently on Mounjaro 7.5 mg weekly and metformin 2000 mg daily.  He is having occasional headache and diarrhea but this is manageable.  We did discuss decreasing dose of metformin however we will continue with current regimen for now.  Recheck A1c in 3 months.  Depending on result of A1c may be able to decrease dose of metformin to 1000 mg daily.  Recheck urine albumin creatinine ratio today.   Hypertension associated with diabetes (HCC) At goal on amlodipine olmesartan 10-40 once daily.     Subjective:  HPI:  See A/P for status of chronic conditions.  Patient is here today for follow-up.  He was here about a month ago for annual physical however it was too early to recheck A1c at that time.  We increased his Mounjaro to 7.5 mg weekly and continue metformin 2000 mg daily.  He having occasional diarrhea and headache but this is manageable.        Objective:  Physical Exam: BP 136/81   Pulse 80   Temp 97.7 F (36.5 C) (Temporal)   Ht 5\' 9"  (1.753 m)   Wt 179 lb 9.6 oz (81.5 kg)   SpO2 98%   BMI 26.52 kg/m   Wt Readings from Last 3 Encounters:  05/01/23 179 lb 9.6 oz (81.5 kg)  04/02/23 181 lb 3.2 oz (82.2 kg)  02/12/23 184 lb 3.2 oz (83.6 kg)    Gen: No acute distress, resting comfortably CV: Regular rate and rhythm with no murmurs appreciated Pulm: Normal work of breathing, clear to auscultation bilaterally with no crackles, wheezes, or rhonchi Neuro: Grossly normal, moves all extremities Psych: Normal affect and thought content      Blas Riches M. [name], MD 05/01/2023 8:18 AM

## 2023-05-01 NOTE — Assessment & Plan Note (Signed)
 A1c much better controlled today at 7.1.  He is currently on Mounjaro 7.5 mg weekly and metformin 2000 mg daily.  He is having occasional headache and diarrhea but this is manageable.  We did discuss decreasing dose of metformin however we will continue with current regimen for now.  Recheck A1c in 3 months.  Depending on result of A1c may be able to decrease dose of metformin to 1000 mg daily.  Recheck urine albumin creatinine ratio today.

## 2023-05-01 NOTE — Assessment & Plan Note (Signed)
At goal on amlodipine-olmesartan 10-40 once daily.

## 2023-05-02 ENCOUNTER — Encounter: Payer: Self-pay | Admitting: Family Medicine

## 2023-05-02 NOTE — Progress Notes (Signed)
 Urine protein ratio is improving.  This should continue to improve with his sugars improving.  We can recheck again in 3 months.

## 2023-05-13 ENCOUNTER — Ambulatory Visit: Payer: Medicare Other | Admitting: Family Medicine

## 2023-05-27 DIAGNOSIS — H524 Presbyopia: Secondary | ICD-10-CM | POA: Diagnosis not present

## 2023-05-27 DIAGNOSIS — H53143 Visual discomfort, bilateral: Secondary | ICD-10-CM | POA: Diagnosis not present

## 2023-05-27 DIAGNOSIS — E119 Type 2 diabetes mellitus without complications: Secondary | ICD-10-CM | POA: Diagnosis not present

## 2023-05-27 LAB — HM DIABETES EYE EXAM

## 2023-06-18 ENCOUNTER — Other Ambulatory Visit: Payer: Self-pay | Admitting: Family Medicine

## 2023-06-18 NOTE — Telephone Encounter (Signed)
 Copied from CRM [id number]. Topic: Clinical - Medication Refill >> Jun 18, 2023  1:19 PM Sharnea C wrote: Medication: amLODipine -olmesartan  (AZOR ) 10-40 MG tablet   Has the patient contacted their pharmacy? Yes (Agent: If no, request that the patient contact the pharmacy for the refill. If patient does not wish to contact the pharmacy document the reason why and proceed with request.) (Agent: If yes, when and what did the pharmacy advise?)  This is the patient's preferred pharmacy:  [id number] - [place], [place] - [address] [place] [address] Phone: [phone] Fax: [phone]    Is this the correct pharmacy for this prescription? Yes If no, delete pharmacy and type the correct one.   Has the prescription been filled recently? no  Is the patient out of the medication? Yes  Has the patient been seen for an appointment in the last year OR does the patient have an upcoming appointment? Yes  Can we respond through MyChart? No  Agent: Please be advised that Rx refills may take up to 3 business days. We ask that you follow-up with your pharmacy.

## 2023-06-19 MED ORDER — AMLODIPINE-OLMESARTAN 10-40 MG PO TABS: 1.0000 | ORAL_TABLET | Freq: Every day | ORAL | 3 refills | Status: AC

## 2023-08-07 ENCOUNTER — Telehealth: Payer: Self-pay | Admitting: Family Medicine

## 2023-08-07 ENCOUNTER — Ambulatory Visit: Admitting: Family Medicine

## 2023-08-07 VITALS — BP 124/78 | HR 67 | Temp 98.1°F | Ht 69.0 in | Wt 177.8 lb

## 2023-08-07 DIAGNOSIS — E1159 Type 2 diabetes mellitus with other circulatory complications: Secondary | ICD-10-CM

## 2023-08-07 DIAGNOSIS — Z7984 Long term (current) use of oral hypoglycemic drugs: Secondary | ICD-10-CM

## 2023-08-07 DIAGNOSIS — E785 Hyperlipidemia, unspecified: Secondary | ICD-10-CM | POA: Diagnosis not present

## 2023-08-07 DIAGNOSIS — Z96651 Presence of right artificial knee joint: Secondary | ICD-10-CM | POA: Insufficient documentation

## 2023-08-07 DIAGNOSIS — I152 Hypertension secondary to endocrine disorders: Secondary | ICD-10-CM | POA: Diagnosis not present

## 2023-08-07 DIAGNOSIS — L309 Dermatitis, unspecified: Secondary | ICD-10-CM | POA: Diagnosis not present

## 2023-08-07 DIAGNOSIS — M1990 Unspecified osteoarthritis, unspecified site: Secondary | ICD-10-CM

## 2023-08-07 DIAGNOSIS — E1169 Type 2 diabetes mellitus with other specified complication: Secondary | ICD-10-CM | POA: Diagnosis not present

## 2023-08-07 LAB — POCT GLYCOSYLATED HEMOGLOBIN (HGB A1C): Hemoglobin A1C: 7.3 % — AB (ref 4.0–5.6)

## 2023-08-07 MED ORDER — HYDROCORTISONE 2.5 % EX CREA: TOPICAL_CREAM | Freq: Two times a day (BID) | CUTANEOUS | 5 refills | Status: DC

## 2023-08-07 MED ORDER — TIRZEPATIDE 7.5 MG/0.5ML ~~LOC~~ SOAJ: 7.5000 mg | SUBCUTANEOUS | 1 refills | Status: DC

## 2023-08-07 NOTE — Patient Instructions (Signed)
 It was very nice to see you today!  Your Hemoglobin A1c today is 7.3.  Please continue to work on diet and exercise.   I will refill your eczema   Return in about 6 months (around 02/07/2024) for Follow Up.   Take care, Dr Kennyth  PLEASE NOTE:  If you had any lab tests, please let us  know if you have not heard back within a few days. You may see your results on mychart before we have a chance to review them but we will give you a call once they are reviewed by us .   If we ordered any referrals today, please let us  know if you have not heard from their office within the next week.   If you had any urgent prescriptions sent in today, please check with the pharmacy within an hour of our visit to make sure the prescription was transmitted appropriately.   Please try these tips to maintain a healthy lifestyle:  Eat at least 3 REAL meals and 1-2 snacks per day.  Aim for no more than 5 hours between eating.  If you eat breakfast, please do so within one hour of getting up.   Each meal should contain half fruits/vegetables, one quarter protein, and one quarter carbs (no bigger than a computer mouse)  Cut down on sweet beverages. This includes juice, soda, and sweet tea.   Drink at least 1 glass of water with each meal and aim for at least 8 glasses per day  Exercise at least 150 minutes every week.

## 2023-08-07 NOTE — Telephone Encounter (Signed)
 Copied from CRM [id number]. Topic: Clinical - Medication Refill >> Aug 07, 2023  2:31 PM Viola F wrote: Medication: tirzepatide  (MOUNJARO ) 7.5 MG/0.5ML Pen [[id number]]  Has the patient contacted their pharmacy? Yes (Agent: If no, request that the patient contact the pharmacy for the refill. If patient does not wish to contact the pharmacy document the reason why and proceed with request.) (Agent: If yes, when and what did the pharmacy advise?)  This is the patient's preferred pharmacy:   [id number] - [place], [place] - [address] [place] [address] Phone: [phone] Fax: [phone]  Is this the correct pharmacy for this prescription? Yes If no, delete pharmacy and type the correct one.   Has the prescription been filled recently? Yes  Is the patient out of the medication? Yes  Has the patient been seen for an appointment in the last year OR does the patient have an upcoming appointment? Yes  Can we respond through MyChart? Yes  Agent: Please be advised that Rx refills may take up to 3 business days. We ask that you follow-up with your pharmacy.

## 2023-08-07 NOTE — Progress Notes (Signed)
   Matthew Harris is a 68 y.o. male who presents today for an office visit.  Assessment/Plan:  Chronic Problems Addressed Today: DM type 2 with diabetic dyslipidemia (HCC) A1c stable 7.3.  He will start working more on exercise.  He is also working on reducing carbs and sugar intake.  Will continue current regimen Mounjaro  7.5 mg weekly and metformin  2000 mg daily.  He will come back in 6 months and we can recheck A1c at that time.  Hypertension associated with diabetes (HCC) Blood pressure at goal today on amlodipine  olmesartan  10-40 once daily.  Osteoarthritis Mild flareup recently.  Uses meloxicam  as needed.  Symptoms are currently manageable.  We discussed ice and compression.  He will let us  know if symptoms worsen and would consider referral to sports medicine or orthopedics at that time.  Eczema Stable on hydrocortisone  cream as needed.  Will refill today.     Subjective:  HPI:  See Assessment / plan for status of chronic conditions. Patient is here today for follow up.  He has no acute concerns.       Objective:  Physical Exam: BP 124/78 (BP Location: Right Arm, Patient Position: Sitting, Cuff Size: Normal)   Pulse 67   Temp 98.1 F (36.7 C) (Temporal)   Ht 5' 9 (1.753 m)   Wt 177 lb 12.8 oz (80.6 kg)   SpO2 97%   BMI 26.26 kg/m   Wt Readings from Last 3 Encounters:  08/07/23 177 lb 12.8 oz (80.6 kg)  05/01/23 179 lb 9.6 oz (81.5 kg)  04/02/23 181 lb 3.2 oz (82.2 kg)    Gen: No acute distress, resting comfortably CV: Regular rate and rhythm with no murmurs appreciated Pulm: Normal work of breathing, clear to auscultation bilaterally with no crackles, wheezes, or rhonchi Neuro: Grossly normal, moves all extremities Psych: Normal affect and thought content      Solon Alban M. Kennyth, MD 08/07/2023 8:26 AM

## 2023-08-07 NOTE — Assessment & Plan Note (Signed)
 Stable on hydrocortisone  cream as needed.  Will refill today.

## 2023-08-07 NOTE — Assessment & Plan Note (Signed)
 A1c stable 7.3.  He will start working more on exercise.  He is also working on reducing carbs and sugar intake.  Will continue current regimen Mounjaro  7.5 mg weekly and metformin  2000 mg daily.  He will come back in 6 months and we can recheck A1c at that time.

## 2023-08-07 NOTE — Assessment & Plan Note (Signed)
Blood pressure at goal today on amlodipine-olmesartan 10-40 once daily.

## 2023-08-07 NOTE — Assessment & Plan Note (Signed)
 Mild flareup recently.  Uses meloxicam  as needed.  Symptoms are currently manageable.  We discussed ice and compression.  He will let us  know if symptoms worsen and would consider referral to sports medicine or orthopedics at that time.

## 2023-09-17 ENCOUNTER — Other Ambulatory Visit: Payer: Self-pay | Admitting: *Deleted

## 2023-09-17 MED ORDER — METFORMIN HCL ER 500 MG PO TB24: 2000.0000 mg | ORAL_TABLET | Freq: Every day | ORAL | 1 refills | Status: AC

## 2023-09-18 ENCOUNTER — Other Ambulatory Visit: Payer: Self-pay | Admitting: *Deleted

## 2023-12-12 ENCOUNTER — Other Ambulatory Visit: Payer: Self-pay | Admitting: Family Medicine

## 2023-12-25 NOTE — Progress Notes (Signed)
 Matthew Harris                                          MRN: [id number]   12/25/2023   The [facility] Quality Team Specialist reviewed this patient medical record for the purposes of chart review for care gap closure. The following were reviewed: abstraction for care gap closure-glycemic status assessment. KED closed in [facility] portal.    [facility] Quality Team

## 2024-01-16 ENCOUNTER — Other Ambulatory Visit: Payer: Self-pay | Admitting: Family Medicine

## 2024-02-07 ENCOUNTER — Encounter: Payer: Self-pay | Admitting: Family Medicine

## 2024-02-07 ENCOUNTER — Ambulatory Visit: Admitting: Family Medicine

## 2024-02-07 ENCOUNTER — Ambulatory Visit: Payer: Self-pay | Admitting: Family Medicine

## 2024-02-07 VITALS — BP 122/82 | HR 83 | Temp 97.3°F | Ht 69.0 in | Wt 176.4 lb

## 2024-02-07 DIAGNOSIS — E1159 Type 2 diabetes mellitus with other circulatory complications: Secondary | ICD-10-CM

## 2024-02-07 DIAGNOSIS — E1169 Type 2 diabetes mellitus with other specified complication: Secondary | ICD-10-CM

## 2024-02-07 DIAGNOSIS — I152 Hypertension secondary to endocrine disorders: Secondary | ICD-10-CM

## 2024-02-07 DIAGNOSIS — Z7984 Long term (current) use of oral hypoglycemic drugs: Secondary | ICD-10-CM | POA: Diagnosis not present

## 2024-02-07 DIAGNOSIS — E1165 Type 2 diabetes mellitus with hyperglycemia: Secondary | ICD-10-CM

## 2024-02-07 DIAGNOSIS — L309 Dermatitis, unspecified: Secondary | ICD-10-CM | POA: Diagnosis not present

## 2024-02-07 DIAGNOSIS — E785 Hyperlipidemia, unspecified: Secondary | ICD-10-CM

## 2024-02-07 DIAGNOSIS — Z7985 Long-term (current) use of injectable non-insulin antidiabetic drugs: Secondary | ICD-10-CM

## 2024-02-07 DIAGNOSIS — M1990 Unspecified osteoarthritis, unspecified site: Secondary | ICD-10-CM | POA: Diagnosis not present

## 2024-02-07 LAB — POCT GLYCOSYLATED HEMOGLOBIN (HGB A1C): Hemoglobin A1C: 7.3 % — AB (ref 4.0–5.6)

## 2024-02-07 LAB — MICROALBUMIN / CREATININE URINE RATIO
Creatinine,U: 46 mg/dL
Microalb Creat Ratio: 689.2 mg/g — ABNORMAL HIGH (ref 0.0–30.0)
Microalb, Ur: 31.7 mg/dL — ABNORMAL HIGH (ref 0.7–1.9)

## 2024-02-07 MED ORDER — HYDROCORTISONE 2.5 % EX CREA: TOPICAL_CREAM | Freq: Two times a day (BID) | CUTANEOUS | 5 refills | Status: AC

## 2024-02-07 MED ORDER — MELOXICAM 15 MG PO TABS: 15.0000 mg | ORAL_TABLET | Freq: Every day | ORAL | 0 refills | Status: AC

## 2024-02-07 NOTE — Assessment & Plan Note (Signed)
 Patient with more pain in the left shoulder.  Does have some underlying osteoarthritis based on previous imaging though likely does have some rotator cuff issues as well.  We did discuss referral to see PT for orthopedics however he like to hold off on this for now.  Will refill his meloxicam .  He will let us  know if he changes his mind about referral.

## 2024-02-07 NOTE — Patient Instructions (Signed)
 It was very nice to see you today!  VISIT SUMMARY: During your visit, we discussed your left shoulder pain, diabetes management, and skin issues. We have made some adjustments to your medications and provided recommendations for managing your conditions.  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 -We have refilled your meloxicam  prescription. -If your symptoms persist, consider seeing an orthopedic specialist or starting physical therapy.  TYPE 2 DIABETES MELLITUS WITH HYPERGLYCEMIA AND DIABETIC DYSLIPIDEMIA: Your A1c remains at 7.3, and your current medications are effective. -Continue taking Mounjaro  7.5 mg weekly and Metformin  2000 mg daily. -We discussed dietary modifications and reducing soda intake. -Increase your fiber intake and reduce soda consumption. -Monitor your A1c levels, and we may adjust your medication if your A1c exceeds 8.  ECZEMA: You have dry, itchy patches on your face and knees, which may be related to your diabetes and winter weather. -We have refilled your hydrocortisone  cream prescription. -Continue using over-the-counter lotions to keep your skin hydrated.  Return for Annual Physical.   Take care, Dr Kennyth  PLEASE NOTE:  If you had any lab tests, please let us  know if you have not heard back within a few days. You may see your results on mychart before we have a chance to review them but we will give you a call once they are reviewed by us .   If we ordered any referrals today, please let us  know if you have not heard from their office within the next week.   If you had any urgent prescriptions sent in today, please check with the pharmacy within an hour of our visit to make sure the prescription was transmitted appropriately.   Please try these tips to maintain a healthy lifestyle:  Eat at least 3 REAL meals and 1-2  snacks per day.  Aim for no more than 5 hours between eating.  If you eat breakfast, please do so within one hour of getting up.   Each meal should contain half fruits/vegetables, one quarter protein, and one quarter carbs (no bigger than a computer mouse)  Cut down on sweet beverages. This includes juice, soda, and sweet tea.   Drink at least 1 glass of water with each meal and aim for at least 8 glasses per day  Exercise at least 150 minutes every week.

## 2024-02-07 NOTE — Assessment & Plan Note (Signed)
 A1c stable at 7.3.  We discussed lifestyle modifications.  He will work on improving his diet and activity levels.  Continue current regimen Mounjaro  7.5 mg weekly and metformin  2000 mg daily.  Recheck A1c in a few months when he comes back for his CPE.

## 2024-02-07 NOTE — Assessment & Plan Note (Addendum)
Blood pressure at goal today on amlodipine-olmesartan 10-40 once daily.

## 2024-02-07 NOTE — Assessment & Plan Note (Signed)
 Mild flare recently though typically well-controlled with hydrocortisone  cream.  Will refill today.

## 2024-02-07 NOTE — Progress Notes (Signed)
 "  Matthew Harris is a 69 y.o. male who presents today for an office visit.  Assessment/Plan:  Chronic Problems Addressed Today: Type 2 diabetes mellitus with hyperglycemia (HCC) A1c stable at 7.3.  We discussed lifestyle modifications.  He will work on improving his diet and activity levels.  Continue current regimen Mounjaro  7.5 mg weekly and metformin  2000 mg daily.  Recheck A1c in a few months when he comes back for his CPE.  Hypertension associated with diabetes (HCC) Blood pressure at goal today on amlodipine  olmesartan  10-40 once daily  Osteoarthritis Patient with more pain in the left shoulder.  Does have some underlying osteoarthritis based on previous imaging though likely does have some rotator cuff issues as well.  We did discuss referral to see PT for orthopedics however he like to hold off on this for now.  Will refill his meloxicam .  He will let us  know if he changes his mind about referral.  Eczema Mild flare recently though typically well-controlled with hydrocortisone  cream.  Will refill today.     Subjective:  HPI:  See assessment / plan for status of chronic conditions.      Discussed the use of AI scribe software for clinical note transcription with the patient, who gave verbal consent to proceed.  History of Present Illness Matthew Harris is a 69 year old male who presents with left shoulder pain.  He has experienced worsening left shoulder pain over the past two months, significantly impacting his ability to play golf and perform daily activities such as putting on a coat. The pain is most severe in the morning, making it difficult to get out of bed. He has been performing rotator cuff exercises found online, but these have not alleviated the pain. He recalls being told previously that an x-ray showed arthritis in the shoulder. He uses meloxicam  before playing golf and Voltaren cream, though he reports the cream is ineffective. He needs more meloxicam  as he is running  low.  He has been experiencing dry, itchy patches on his skin over the past couple of months, which he suspects might be related to his diabetes. He wakes up with blood on his fingers from scratching. He uses over-the-counter lotions and has previously used a 2.5% cream, which he is nearly out of. He wears a wide-brimmed cap when playing golf to protect his skin.  Regarding his diabetes management, he is on Mounjaro  and metformin , which he feels are effective, though he occasionally experiences constipation followed by diarrhea. He takes Metamucil and flaxseed for fiber. His A1c was reported to be 7.3. He has resumed drinking diet sodas and is trying to reduce his intake. He discusses his dietary habits, noting a preference for high-fiber foods like cucumbers and almonds, and has reduced his intake of sugary foods. He has switched from eating gravy biscuits to veggie omelets, which he finds satisfying and filling.         Objective:  Physical Exam: BP 122/82   Pulse 83   Temp (!) 97.3 F (36.3 C) (Temporal)   Ht 5' 9 (1.753 m)   Wt 176 lb 6.4 oz (80 kg)   SpO2 98%   BMI 26.05 kg/m   Wt Readings from Last 3 Encounters:  02/07/24 176 lb 6.4 oz (80 kg)  08/07/23 177 lb 12.8 oz (80.6 kg)  05/01/23 179 lb 9.6 oz (81.5 kg)    Gen: No acute distress, resting comfortably CV: Regular rate and rhythm with no murmurs appreciated Pulm: Normal work of  breathing, clear to auscultation bilaterally with no crackles, wheezes, or rhonchi MUSCULOSKELETAL: - Left Arm: No deformities.  Tender to palpation along anterior and lateral aspect.  Pain elicited with resisted external rotation.  Neurovascular intact distally. Neuro: Grossly normal, moves all extremities Psych: Normal affect and thought content      Bayden Gil M. Kennyth, MD 02/07/2024 8:14 AM  "

## 2024-02-07 NOTE — Progress Notes (Signed)
 Still has protein in his urine similar to the last few times we have checked.  This is related to his diabetes and should improve with diabetic control.  We could add on an additional diabetes medication if he is interested or we can check again at his follow-up visit with me in a few months.

## 2024-02-12 ENCOUNTER — Telehealth: Payer: Self-pay | Admitting: *Deleted

## 2024-02-12 NOTE — Telephone Encounter (Signed)
 Copied from CRM [id number]. Topic: Clinical - Lab/Test Results >> Feb 12, 2024  8:37 AM Rea ORN wrote: Reason for CRM: Pt returned missed a call from CMA. I advised note from 02/07/24 encounter. Pt stated he is not interested in starting any new medications. Pt stated he is already on 2 diabetic meds and does not want more. Pt also stated and his next appt, PCP will recheck his A1C so he will wait until then.   Noted  Aadhya Bustamante,RMA

## 2024-05-19 ENCOUNTER — Encounter: Admitting: Family Medicine
